# Patient Record
Sex: Female | Born: 1975 | Hispanic: No | Smoking: Former smoker
Health system: Southern US, Community
[De-identification: ages and names within clinical notes are randomized; demographics above are authoritative.]

## PROBLEM LIST (undated history)

## (undated) HISTORY — PX: WISDOM TOOTH EXTRACTION: SHX21

---

## 2006-02-15 ENCOUNTER — Other Ambulatory Visit: Admission: RE | Admit: 2006-02-15 | Discharge: 2006-02-15 | Payer: Self-pay | Admitting: Family Medicine

## 2007-04-18 ENCOUNTER — Other Ambulatory Visit: Admission: RE | Admit: 2007-04-18 | Discharge: 2007-04-18 | Payer: Self-pay | Admitting: Family Medicine

## 2008-06-13 ENCOUNTER — Other Ambulatory Visit: Admission: RE | Admit: 2008-06-13 | Discharge: 2008-06-13 | Payer: Self-pay | Admitting: Family Medicine

## 2009-06-19 ENCOUNTER — Other Ambulatory Visit: Admission: RE | Admit: 2009-06-19 | Discharge: 2009-06-19 | Payer: Self-pay | Admitting: Family Medicine

## 2010-06-19 ENCOUNTER — Other Ambulatory Visit
Admission: RE | Admit: 2010-06-19 | Discharge: 2010-06-19 | Payer: Self-pay | Source: Home / Self Care | Admitting: Family Medicine

## 2011-08-10 ENCOUNTER — Other Ambulatory Visit (HOSPITAL_COMMUNITY)
Admission: RE | Admit: 2011-08-10 | Discharge: 2011-08-10 | Disposition: A | Discharge status: Home / Self Care | Payer: BC Managed Care – PPO | Source: Ambulatory Visit | Attending: Family Medicine | Admitting: Family Medicine

## 2011-08-10 ENCOUNTER — Other Ambulatory Visit: Payer: Self-pay | Admitting: Physician Assistant

## 2011-08-10 DIAGNOSIS — Z01419 Encounter for gynecological examination (general) (routine) without abnormal findings: Secondary | ICD-10-CM | POA: Insufficient documentation

## 2013-02-06 ENCOUNTER — Other Ambulatory Visit (HOSPITAL_COMMUNITY)
Admission: RE | Admit: 2013-02-06 | Discharge: 2013-02-06 | Disposition: A | Discharge status: Home / Self Care | Payer: BC Managed Care – PPO | Source: Ambulatory Visit | Attending: Family Medicine | Admitting: Family Medicine

## 2013-02-06 ENCOUNTER — Other Ambulatory Visit: Payer: Self-pay | Admitting: Physician Assistant

## 2013-02-06 DIAGNOSIS — Z124 Encounter for screening for malignant neoplasm of cervix: Secondary | ICD-10-CM | POA: Insufficient documentation

## 2013-07-23 DIAGNOSIS — F319 Bipolar disorder, unspecified: Secondary | ICD-10-CM

## 2013-07-23 DIAGNOSIS — F419 Anxiety disorder, unspecified: Secondary | ICD-10-CM

## 2013-07-23 DIAGNOSIS — F32A Depression, unspecified: Secondary | ICD-10-CM

## 2013-07-23 HISTORY — DX: Bipolar disorder, unspecified: F31.9

## 2013-07-23 HISTORY — DX: Anxiety disorder, unspecified: F41.9

## 2013-07-23 HISTORY — DX: Depression, unspecified: F32.A

## 2014-05-02 ENCOUNTER — Other Ambulatory Visit: Payer: Self-pay | Admitting: Physician Assistant

## 2014-05-02 ENCOUNTER — Other Ambulatory Visit (HOSPITAL_COMMUNITY)
Admission: RE | Admit: 2014-05-02 | Discharge: 2014-05-02 | Disposition: A | Discharge status: Home / Self Care | Payer: BC Managed Care – PPO | Source: Ambulatory Visit | Attending: Family Medicine | Admitting: Family Medicine

## 2014-05-02 DIAGNOSIS — Z1151 Encounter for screening for human papillomavirus (HPV): Secondary | ICD-10-CM | POA: Diagnosis present

## 2014-05-02 DIAGNOSIS — Z124 Encounter for screening for malignant neoplasm of cervix: Secondary | ICD-10-CM | POA: Diagnosis not present

## 2014-05-02 DIAGNOSIS — R87810 Cervical high risk human papillomavirus (HPV) DNA test positive: Secondary | ICD-10-CM | POA: Insufficient documentation

## 2014-05-04 LAB — CYTOLOGY - PAP

## 2014-06-11 ENCOUNTER — Other Ambulatory Visit: Payer: Self-pay | Admitting: Obstetrics and Gynecology

## 2014-12-11 ENCOUNTER — Other Ambulatory Visit (HOSPITAL_COMMUNITY)
Admission: RE | Admit: 2014-12-11 | Discharge: 2014-12-11 | Disposition: A | Discharge status: Home / Self Care | Payer: BC Managed Care – PPO | Source: Ambulatory Visit | Attending: Obstetrics and Gynecology | Admitting: Obstetrics and Gynecology

## 2014-12-11 ENCOUNTER — Other Ambulatory Visit: Payer: Self-pay | Admitting: Obstetrics and Gynecology

## 2014-12-11 DIAGNOSIS — Z01419 Encounter for gynecological examination (general) (routine) without abnormal findings: Secondary | ICD-10-CM | POA: Insufficient documentation

## 2014-12-13 LAB — CYTOLOGY - PAP

## 2015-06-18 ENCOUNTER — Other Ambulatory Visit (HOSPITAL_COMMUNITY)
Admission: RE | Admit: 2015-06-18 | Discharge: 2015-06-18 | Disposition: A | Discharge status: Home / Self Care | Payer: BC Managed Care – PPO | Source: Ambulatory Visit | Attending: Obstetrics and Gynecology | Admitting: Obstetrics and Gynecology

## 2015-06-18 ENCOUNTER — Other Ambulatory Visit: Payer: Self-pay | Admitting: Obstetrics and Gynecology

## 2015-06-18 DIAGNOSIS — Z01419 Encounter for gynecological examination (general) (routine) without abnormal findings: Secondary | ICD-10-CM | POA: Diagnosis not present

## 2015-06-20 LAB — CYTOLOGY - PAP

## 2017-08-06 ENCOUNTER — Other Ambulatory Visit (HOSPITAL_COMMUNITY)
Admission: RE | Admit: 2017-08-06 | Discharge: 2017-08-06 | Disposition: A | Discharge status: Home / Self Care | Payer: BC Managed Care – PPO | Source: Ambulatory Visit | Attending: Family Medicine | Admitting: Family Medicine

## 2017-08-06 ENCOUNTER — Other Ambulatory Visit: Payer: Self-pay | Admitting: Physician Assistant

## 2017-08-06 DIAGNOSIS — Z01419 Encounter for gynecological examination (general) (routine) without abnormal findings: Secondary | ICD-10-CM | POA: Insufficient documentation

## 2017-08-09 LAB — CYTOLOGY - PAP: DIAGNOSIS: NEGATIVE

## 2018-05-24 ENCOUNTER — Encounter: Payer: Self-pay | Admitting: Emergency Medicine

## 2018-05-24 DIAGNOSIS — G4700 Insomnia, unspecified: Secondary | ICD-10-CM

## 2018-05-24 DIAGNOSIS — F3160 Bipolar disorder, current episode mixed, unspecified: Secondary | ICD-10-CM

## 2018-06-06 ENCOUNTER — Encounter: Payer: Self-pay | Admitting: Physician Assistant

## 2018-06-06 ENCOUNTER — Ambulatory Visit: Payer: BC Managed Care – PPO | Admitting: Physician Assistant

## 2018-06-06 DIAGNOSIS — G4700 Insomnia, unspecified: Secondary | ICD-10-CM | POA: Diagnosis not present

## 2018-06-06 DIAGNOSIS — F331 Major depressive disorder, recurrent, moderate: Secondary | ICD-10-CM

## 2018-06-06 MED ORDER — BUPROPION HCL ER (XL) 150 MG PO TB24: 450.0000 mg | ORAL_TABLET | Freq: Every morning | ORAL | 5 refills | Status: DC

## 2018-06-06 MED ORDER — ESZOPICLONE 3 MG PO TABS: 3.0000 mg | ORAL_TABLET | Freq: Every evening | ORAL | 0 refills | Status: DC | PRN

## 2018-06-06 MED ORDER — ALPRAZOLAM 1 MG PO TABS: 1.0000 mg | ORAL_TABLET | Freq: Two times a day (BID) | ORAL | 5 refills | Status: DC | PRN

## 2018-06-06 MED ORDER — BREXPIPRAZOLE 2 MG PO TABS: 2.0000 mg | ORAL_TABLET | Freq: Every day | ORAL | 5 refills | Status: DC

## 2018-06-06 NOTE — Progress Notes (Signed)
[facility]  Patient ID: Leslie GentaLarissa V Imperato-Goodman,  MRN: [id number]  PCP: Patient, No Pcp Per  Date of Evaluation: 06/06/2018 Time spent:15 minutes  Chief Complaint:  Chief Complaint    Follow-up      HISTORY/CURRENT STATUS: HPI Here for routine med check.  About 4-5 days ago, got into a 'funk.' Took off work 06/02/18 in the afternoon and all day on 06/03/18.  Feels better now.  Not sure what caused it.  Prior to that, was having some impulsivity, spending more than normal, but no risky behaviors.  No increased libido.  No increased energy or grandiosity.  Not able to go to sleep for a few hours, then wakes up around 2:30 to go to the BR.  She has been taking mirtazapine for a while now and it has been effective up until the past month or so.  Then gets up at 4:30 to exercise. Gets about  5 hours of sleep.   Also has been anxious lately.  Has trouble relaxing.  Doesn't like her job and is looking for something else.  The Xanax still works.  Individual Medical History/ Review of Systems: Changes? :No    Past medications for mental health diagnoses include: Depakote, Lamictal, Latuda, Seroquel, melatonin did not help, for regular cause tremor and anxiety, Sonata did not help, Ambien caused her to eat too much, trazodone did not help, Lunesta, Zoloft, Wellbutrin, Restoril did not help, Belsomra  Allergies: Lamictal [lamotrigine]  Current Medications:  Current Outpatient Medications:  .  ALPRAZolam (XANAX) 1 MG tablet, Take 1 tablet (1 mg total) by mouth 2 (two) times daily as needed for anxiety., Disp: 60 tablet, Rfl: 5 .  Brexpiprazole (REXULTI) 2 MG TABS, Take 2 mg by mouth daily., Disp: 30 tablet, Rfl: 5 .  buPROPion (WELLBUTRIN XL) 150 MG 24 hr tablet, Take 3 tablets (450 mg total) by mouth every morning., Disp: 90 tablet, Rfl: 5 .  Eszopiclone (ESZOPICLONE) 3 MG TABS, Take 1 tablet (3 mg total) by mouth at bedtime as needed. Take immediately before bedtime, Disp:  30 tablet, Rfl: 0 .  mirtazapine (REMERON) 30 MG tablet, Take 45 mg by mouth at bedtime. , Disp: , Rfl:  Medication Side Effects: none  Family Medical/ Social History: Changes? No  MENTAL HEALTH EXAM:  There were no vitals taken for this visit.There is no height or weight on file to calculate BMI.  General Appearance: Casual and Well Groomed  Eye Contact:  Good  Speech:  Clear and Coherent  Volume:  Normal  Mood:  Euthymic  Affect:  Appropriate  Thought Process:  Goal Directed  Orientation:  Full (Time, Place, and Person)  Thought Content: Logical   Suicidal Thoughts:  No  Homicidal Thoughts:  No  Memory:  WNL  Judgement:  Good  Insight:  Good  Psychomotor Activity:  Normal  Concentration:  Concentration: Good  Recall:  Good  Fund of Knowledge: Good  Language: Good  Assets:  Desire for Improvement  ADL's:  Intact  Cognition: WNL  Prognosis:  Good    DIAGNOSES:    ICD-10-CM   1. Major depressive disorder, recurrent episode, moderate (HCC) F33.1   2. Insomnia, unspecified type G47.00     Receiving Psychotherapy: No    RECOMMENDATIONS: Wean off mirtazapine by going to 30 mg nightly for 5 days then 15 mg nightly for 5 days and then stop. Restart Lunesta as needed. If Alfonso PattenLunesta is ineffective after about a week, she will call and we will call  in Elavil 25 mg nightly.   Whichever drug is effective, I will refill until the next visit in 6 months.  Melony Overly, PA-C

## 2018-06-09 ENCOUNTER — Other Ambulatory Visit: Payer: Self-pay | Admitting: Physician Assistant

## 2018-06-09 DIAGNOSIS — Z1231 Encounter for screening mammogram for malignant neoplasm of breast: Secondary | ICD-10-CM

## 2018-06-13 ENCOUNTER — Ambulatory Visit
Admission: RE | Admit: 2018-06-13 | Discharge: 2018-06-13 | Disposition: A | Discharge status: Home / Self Care | Payer: BC Managed Care – PPO | Source: Ambulatory Visit | Attending: Physician Assistant | Admitting: Physician Assistant

## 2018-06-13 DIAGNOSIS — Z1231 Encounter for screening mammogram for malignant neoplasm of breast: Secondary | ICD-10-CM

## 2018-06-14 ENCOUNTER — Other Ambulatory Visit: Payer: Self-pay | Admitting: Physician Assistant

## 2018-06-14 DIAGNOSIS — R928 Other abnormal and inconclusive findings on diagnostic imaging of breast: Secondary | ICD-10-CM

## 2018-06-17 ENCOUNTER — Ambulatory Visit
Admission: RE | Admit: 2018-06-17 | Discharge: 2018-06-17 | Disposition: A | Discharge status: Home / Self Care | Payer: BC Managed Care – PPO | Source: Ambulatory Visit | Attending: Physician Assistant | Admitting: Physician Assistant

## 2018-06-17 DIAGNOSIS — R928 Other abnormal and inconclusive findings on diagnostic imaging of breast: Secondary | ICD-10-CM

## 2018-06-27 ENCOUNTER — Other Ambulatory Visit: Payer: Self-pay | Admitting: Physician Assistant

## 2018-06-27 ENCOUNTER — Telehealth: Payer: Self-pay | Admitting: Physician Assistant

## 2018-06-27 MED ORDER — AMITRIPTYLINE HCL 25 MG PO TABS: 25.0000 mg | ORAL_TABLET | Freq: Every evening | ORAL | 0 refills | Status: DC | PRN

## 2018-06-27 NOTE — Telephone Encounter (Signed)
Pt called sleep med not working. Would like to try other med discussed. [facility] [address]

## 2018-06-28 ENCOUNTER — Telehealth: Payer: Self-pay | Admitting: Physician Assistant

## 2018-06-28 NOTE — Telephone Encounter (Signed)
Insurance denying Rexulti.  Needs a generic.  Olanzapine has been recommended. [facility] on [address]

## 2018-06-29 NOTE — Telephone Encounter (Signed)
Let pt know that her pharmacy was trying to run it too soon according to insurance. Instructed to try again today or tomorrow. Call back if any problems and we can get some samples till she gets it filled.

## 2018-06-29 NOTE — Telephone Encounter (Signed)
Will check on Prior Authorization for her, have not been notified from pharmacy about this yet

## 2018-06-29 NOTE — Telephone Encounter (Signed)
Attempted to do PA, it indicated pharmacy is trying to fill too soon

## 2018-06-29 NOTE — Telephone Encounter (Signed)
Left voice mail to call back 

## 2018-07-04 ENCOUNTER — Telehealth: Payer: Self-pay | Admitting: Physician Assistant

## 2018-07-04 ENCOUNTER — Other Ambulatory Visit: Payer: Self-pay

## 2018-07-04 MED ORDER — ARIPIPRAZOLE 5 MG PO TABS: 5.0000 mg | ORAL_TABLET | Freq: Every day | ORAL | 1 refills | Status: DC

## 2018-07-04 NOTE — Telephone Encounter (Signed)
Spoke with pt, see phone msg. New medication escribed to [facility].

## 2018-07-04 NOTE — Telephone Encounter (Signed)
Patient stated she hasn't had any Rexulti in a couple of days. Stated it is not a insurance proble ,but the cost is in another tier group. She can't afford the $700 price tag, and need something as soon as possible. Would like a follow-up call since this has not been taking care of since last week.

## 2018-07-04 NOTE — Telephone Encounter (Signed)
Unable to afford rexulti, change to abilify 5mg  per provider. rx sent to [facility] as requested. Pt notified of change and instructed to check pharmacy.

## 2018-07-11 DIAGNOSIS — Z0289 Encounter for other administrative examinations: Secondary | ICD-10-CM

## 2018-07-26 ENCOUNTER — Other Ambulatory Visit: Payer: Self-pay | Admitting: Physician Assistant

## 2018-08-25 ENCOUNTER — Other Ambulatory Visit: Payer: Self-pay | Admitting: Physician Assistant

## 2018-11-21 ENCOUNTER — Other Ambulatory Visit: Payer: Self-pay | Admitting: Physician Assistant

## 2018-12-12 ENCOUNTER — Encounter: Payer: Self-pay | Admitting: Physician Assistant

## 2018-12-12 ENCOUNTER — Other Ambulatory Visit: Payer: Self-pay

## 2018-12-12 ENCOUNTER — Ambulatory Visit: Payer: BC Managed Care – PPO | Admitting: Physician Assistant

## 2018-12-12 DIAGNOSIS — G4700 Insomnia, unspecified: Secondary | ICD-10-CM

## 2018-12-12 DIAGNOSIS — F331 Major depressive disorder, recurrent, moderate: Secondary | ICD-10-CM

## 2018-12-12 MED ORDER — ARIPIPRAZOLE 5 MG PO TABS: 5.0000 mg | ORAL_TABLET | Freq: Every day | ORAL | 5 refills | Status: DC

## 2018-12-12 MED ORDER — ALPRAZOLAM 1 MG PO TABS: 1.0000 mg | ORAL_TABLET | Freq: Two times a day (BID) | ORAL | 5 refills | Status: DC | PRN

## 2018-12-12 MED ORDER — AMITRIPTYLINE HCL 50 MG PO TABS: 50.0000 mg | ORAL_TABLET | Freq: Every day | ORAL | 5 refills | Status: DC

## 2018-12-12 MED ORDER — BUPROPION HCL ER (XL) 150 MG PO TB24: 450.0000 mg | ORAL_TABLET | Freq: Every day | ORAL | 5 refills | Status: DC

## 2018-12-12 NOTE — Progress Notes (Signed)
[facility]  Patient ID: Leslie Mueller,  MRN: [id number]  PCP: Patient, No Pcp Per  Date of Evaluation: 12/12/2018 Time spent:15 minutes  Chief Complaint:  Chief Complaint    Follow-up      HISTORY/CURRENT STATUS: HPI for 5092-month med check.  Her only real problem is not sleeping all the way through the night.  She has a little trouble going to sleep as well such as staying asleep.  The amitriptyline had worked well in the past but is not working as much anymore.  Patient denies loss of interest in usual activities and is able to enjoy things.  Denies decreased energy or motivation.  Appetite has not changed.  No extreme sadness, tearfulness, or feelings of hopelessness.  Denies any changes in concentration, making decisions or remembering things.  Denies suicidal or homicidal thoughts.  Patient denies increased energy with decreased need for sleep, no increased talkativeness, no racing thoughts, no impulsivity or risky behaviors, no increased spending, no increased libido, no grandiosity.  Anxiety is well controlled with the Xanax.  Individual Medical History/ Review of Systems: Changes? :No    Past medications for mental health diagnoses include: Depakote, Lamictal, Latuda, Seroquel, melatonin did not help, for regular cause tremor and anxiety, Sonata did not help, Ambien caused her to eat too much, trazodone did not help, Lunesta, Zoloft, Wellbutrin, Restoril did not help, Belsomra  Allergies: Lamictal [lamotrigine]  Current Medications:  Current Outpatient Medications:  .  ALPRAZolam (XANAX) 1 MG tablet, Take 1 tablet (1 mg total) by mouth 2 (two) times daily as needed for anxiety., Disp: 60 tablet, Rfl: 5 .  ARIPiprazole (ABILIFY) 5 MG tablet, Take 1 tablet (5 mg total) by mouth daily., Disp: 30 tablet, Rfl: 5 .  buPROPion (WELLBUTRIN XL) 150 MG 24 hr tablet, Take 3 tablets (450 mg total) by mouth daily., Disp: 90 tablet, Rfl: 5 .  amitriptyline (ELAVIL) 50  MG tablet, Take 1 tablet (50 mg total) by mouth at bedtime., Disp: 30 tablet, Rfl: 5 Medication Side Effects: none  Family Medical/ Social History: Changes? Yes she quit her job and education.  Is looking for other kinds of work.  She has been offered several jobs already, 1 in [employer] industry which she would really like to except, due to the coronavirus pandemic things have been on hold.  MENTAL HEALTH EXAM:  There were no vitals taken for this visit.There is no height or weight on file to calculate BMI.  General Appearance: Casual  Eye Contact:  Good  Speech:  Clear and Coherent  Volume:  Normal  Mood:  Euthymic  Affect:  Appropriate  Thought Process:  Goal Directed  Orientation:  Full (Time, Place, and Person)  Thought Content: Logical   Suicidal Thoughts:  No  Homicidal Thoughts:  No  Memory:  WNL  Judgement:  Good  Insight:  Good  Psychomotor Activity:  Normal  Concentration:  Concentration: Good  Recall:  Good  Fund of Knowledge: Good  Language: Good  Assets:  Desire for Improvement  ADL's:  Intact  Cognition: WNL  Prognosis:  Good    DIAGNOSES:    ICD-10-CM   1. Major depressive disorder, recurrent episode, moderate (HCC)  F33.1   2. Insomnia, unspecified type  G47.00     Receiving Psychotherapy: No    RECOMMENDATIONS:  Increase amitriptyline to 50 mg p.o. nightly. Continue Xanax 1 mg p.o. twice daily as needed. Continue Abilify 5 mg daily. Continue Wellbutrin XL 450 mg every morning. Return  in 6 months.  Donnal Moat, PA-C   This record has been created using [employer].  Chart creation errors have been sought, but may not always have been located and corrected. Such creation errors do not reflect on the standard of medical care.

## 2019-01-30 ENCOUNTER — Telehealth: Payer: Self-pay | Admitting: Physician Assistant

## 2019-01-30 NOTE — Telephone Encounter (Signed)
Pt needs a statement for her job that she is prescribed Xanax. She would like to come get it in the next few days.

## 2019-02-03 NOTE — Telephone Encounter (Signed)
Note has been typed.  I'll sign when in office in next few days.  Patient will be notified when ready.

## 2019-06-01 ENCOUNTER — Telehealth: Payer: Self-pay | Admitting: Physician Assistant

## 2019-06-01 NOTE — Telephone Encounter (Signed)
Pt called to request refill on ALPRAZOLAM 1 mg, AMITRIPTYLINE 50 mg, and ARIPIPRAZOLE 5 mg. Patient did mention she would like to increase Amitriptyline to next dose. She has appt. 1/19 ( 1st ava) also on canc list. Had to reschedule from 12/22. Provider not available. [place] on file

## 2019-06-02 ENCOUNTER — Other Ambulatory Visit: Payer: Self-pay | Admitting: Physician Assistant

## 2019-06-02 MED ORDER — AMITRIPTYLINE HCL 75 MG PO TABS: 75.0000 mg | ORAL_TABLET | Freq: Every day | ORAL | 1 refills | Status: DC

## 2019-06-02 MED ORDER — ARIPIPRAZOLE 5 MG PO TABS: 5.0000 mg | ORAL_TABLET | Freq: Every day | ORAL | 1 refills | Status: DC

## 2019-06-02 MED ORDER — ALPRAZOLAM 1 MG PO TABS: 1.0000 mg | ORAL_TABLET | Freq: Two times a day (BID) | ORAL | 1 refills | Status: DC | PRN

## 2019-06-02 NOTE — Telephone Encounter (Signed)
Rxs sent

## 2019-06-13 ENCOUNTER — Ambulatory Visit: Payer: Self-pay | Admitting: Physician Assistant

## 2019-07-10 ENCOUNTER — Other Ambulatory Visit: Payer: Self-pay | Admitting: Physician Assistant

## 2019-07-11 ENCOUNTER — Ambulatory Visit: Payer: Self-pay | Admitting: Physician Assistant

## 2019-08-03 ENCOUNTER — Encounter: Payer: Self-pay | Admitting: Physician Assistant

## 2019-08-03 ENCOUNTER — Other Ambulatory Visit: Payer: Self-pay

## 2019-08-03 ENCOUNTER — Ambulatory Visit (INDEPENDENT_AMBULATORY_CARE_PROVIDER_SITE_OTHER): Payer: Self-pay | Admitting: Physician Assistant

## 2019-08-03 DIAGNOSIS — G4700 Insomnia, unspecified: Secondary | ICD-10-CM

## 2019-08-03 DIAGNOSIS — F411 Generalized anxiety disorder: Secondary | ICD-10-CM

## 2019-08-03 DIAGNOSIS — F331 Major depressive disorder, recurrent, moderate: Secondary | ICD-10-CM

## 2019-08-03 MED ORDER — AMITRIPTYLINE HCL 75 MG PO TABS: 75.0000 mg | ORAL_TABLET | Freq: Every day | ORAL | 5 refills | Status: DC

## 2019-08-03 MED ORDER — ARIPIPRAZOLE 5 MG PO TABS: 5.0000 mg | ORAL_TABLET | Freq: Every day | ORAL | 5 refills | Status: DC

## 2019-08-03 MED ORDER — BUPROPION HCL ER (XL) 150 MG PO TB24: 450.0000 mg | ORAL_TABLET | Freq: Every day | ORAL | 5 refills | Status: DC

## 2019-08-03 MED ORDER — ALPRAZOLAM 1 MG PO TABS: 1.0000 mg | ORAL_TABLET | Freq: Two times a day (BID) | ORAL | 5 refills | Status: DC | PRN

## 2019-08-03 MED ORDER — DIAZEPAM 10 MG PO TABS: 5.0000 mg | ORAL_TABLET | Freq: Three times a day (TID) | ORAL | 0 refills | Status: DC | PRN

## 2019-08-03 NOTE — Progress Notes (Signed)
[facility]  Patient ID: Leslie Mueller,  MRN: [id number]  PCP: Patient, No Pcp Per  Date of Evaluation: 08/03/2019 Time spent:20 minutes  Chief Complaint:  Chief Complaint    Anxiety; Depression; Follow-up      HISTORY/CURRENT STATUS: HPI for 48-month med check.  Patient states she is doing really well overall.  Her mood is good.  She is able to enjoy things.  Energy and motivation are good.  "I feel fatigued sometimes even though I am not tired.  I do not know how to describe it.  I feel like my medicines are working well.  I only sleep about 5-1/2 to 6 hours at night but I feel rested when I get up."  Does not cry easily.  Memory and focus are good.  Denies suicidal or homicidal thoughts.  She does not have increased energy even though she is not needing a lot of sleep right now.  States she is a bit impulsive.  She is not spending out of her usual, but she did buy a TV as well as a new computer but both her of her old ones gave out about the same time.  She was impulsive in that she did not do research on the different products like she normally would have.  She went out the next day after each of those products quit working, and bought what she needed right away.  "That is not like me.  I usually do a lot of research to find the best deal or the best quality or what ever."  Denies getting in trouble spending money, she is not maxing out her credit cards.  No risky behaviors.  No increased libido.  No grandiosity.  No hallucinations.  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  She had been accepted to the program last spring but then COVID hit and she was not able to pursue that job.  This is been a lifelong dream but it still scary.  She ask about taking something on the days that she has to train for a crash in the water.  She is not a swimmer and even though she knows she will have a life jacket on, she is very nervous about that.  The Xanax helps  her usual anxiety but she is afraid that with that level of intense anxiety she will not be able to pass that part of her training.  If she is unable to handle a water rescue, she will be thrown out of the program.  Denies dizziness, syncope, seizures, numbness, tingling, tremor, tics, unsteady gait, slurred speech, confusion. Denies muscle or joint pain, stiffness, or dystonia.  She denies polyuria, polydipsia, polyphagia.  No syncope.  No chest pain palpitations or shortness of breath.  No abdominal pain, nausea or vomiting, constipation or diarrhea.  Individual Medical History/ Review of Systems: Changes? :No    Past medications for mental health diagnoses include: Depakote, Lamictal, Latuda, Seroquel, melatonin did not help, for regular cause tremor and anxiety, Sonata did not help, Ambien caused her to eat too much, trazodone did not help, Lunesta, Zoloft, Wellbutrin, Restoril did not help, Belsomra  Allergies: Lamictal [lamotrigine]  Current Medications:  Current Outpatient Medications:  .  ALPRAZolam (XANAX) 1 MG tablet, Take 1 tablet (1 mg total) by mouth 2 (two) times daily as needed for anxiety., Disp: 60 tablet, Rfl: 5 .  amitriptyline (ELAVIL) 75 MG tablet, Take 1 tablet (75 mg total) by mouth at bedtime., Disp:  30 tablet, Rfl: 5 .  ARIPiprazole (ABILIFY) 5 MG tablet, Take 1 tablet (5 mg total) by mouth daily., Disp: 30 tablet, Rfl: 5 .  buPROPion (WELLBUTRIN XL) 150 MG 24 hr tablet, Take 3 tablets (450 mg total) by mouth daily., Disp: 90 tablet, Rfl: 5 .  diazepam (VALIUM) 10 MG tablet, Take 0.5-1 tablets (5-10 mg total) by mouth every 8 (eight) hours as needed for anxiety., Disp: 5 tablet, Rfl: 0 Medication Side Effects: none  Family Medical/ Social History: Changes? Will be starting with [employer] in April. 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  She likes what she does now even though  it is a lot of work.  MENTAL HEALTH EXAM:  There were no vitals taken for this visit.There is no height or weight on file to calculate BMI.  General Appearance: Casual, Neat and Well Groomed  Eye Contact:  Good  Speech:  Clear and Coherent  Volume:  Normal  Mood:  Euthymic  Affect:  Appropriate  Thought Process:  Goal Directed and Descriptions of Associations: Intact  Orientation:  Full (Time, Place, and Person)  Thought Content: Logical   Suicidal Thoughts:  No  Homicidal Thoughts:  No  Memory:  WNL  Judgement:  Good  Insight:  Good  Psychomotor Activity:  Normal  Concentration:  Concentration: Good  Recall:  Good  Fund of Knowledge: Good  Language: Good  Assets:  Desire for Improvement  ADL's:  Intact  Cognition: WNL  Prognosis:  Good    DIAGNOSES:    ICD-10-CM   1. Major depressive disorder, recurrent episode, moderate (HCC)  F33.1   2. Insomnia, unspecified type  G47.00   3. Generalized anxiety disorder  F41.1     Receiving Psychotherapy: No    RECOMMENDATIONS:  PDMP was reviewed. I spent 20 minutes with her. 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  I recommend that she use Valium but she knows not to take it within 8 hours of taking the Xanax.  I am only giving her a few pills.  Sometimes that works better than the Xanax especially since her body is accustomed to the Xanax.  She verbalizes understanding. Start Valium 10 mg, 1/2-1 p.o. every 8 hours as needed anxiety for emergencies, do not take within 8 hours of taking the Xanax. Continue Wellbutrin XL 450 mg daily. Continue Abilify 5 mg every morning. Continue amitriptyline 75 mg nightly. Continue Xanax 1 mg p.o. twice daily as needed. We will have her sign a release form to get labs from [facility] in 6 months.  Donnal Moat, PA-C

## 2020-01-31 ENCOUNTER — Ambulatory Visit (INDEPENDENT_AMBULATORY_CARE_PROVIDER_SITE_OTHER): Payer: Self-pay | Admitting: Physician Assistant

## 2020-01-31 ENCOUNTER — Other Ambulatory Visit: Payer: Self-pay

## 2020-01-31 ENCOUNTER — Encounter: Payer: Self-pay | Admitting: Physician Assistant

## 2020-01-31 DIAGNOSIS — F411 Generalized anxiety disorder: Secondary | ICD-10-CM

## 2020-01-31 DIAGNOSIS — F3341 Major depressive disorder, recurrent, in partial remission: Secondary | ICD-10-CM

## 2020-01-31 DIAGNOSIS — G4700 Insomnia, unspecified: Secondary | ICD-10-CM

## 2020-01-31 MED ORDER — ARIPIPRAZOLE 5 MG PO TABS: 5.0000 mg | ORAL_TABLET | Freq: Every day | ORAL | 5 refills | Status: DC

## 2020-01-31 MED ORDER — AMITRIPTYLINE HCL 75 MG PO TABS: 75.0000 mg | ORAL_TABLET | Freq: Every day | ORAL | 5 refills | Status: DC

## 2020-01-31 MED ORDER — ALPRAZOLAM 1 MG PO TABS: 1.0000 mg | ORAL_TABLET | Freq: Two times a day (BID) | ORAL | 5 refills | Status: DC | PRN

## 2020-01-31 MED ORDER — BUPROPION HCL ER (XL) 150 MG PO TB24: 450.0000 mg | ORAL_TABLET | Freq: Every day | ORAL | 5 refills | Status: DC

## 2020-01-31 NOTE — Progress Notes (Addendum)
[facility]  Patient ID: Leslie Mueller,  MRN: [id number]  PCP: Patient, No Pcp Per  Date of Evaluation: 02/09/2020 Time spent:20 minutes  Chief Complaint:  Chief Complaint    Medication Refill      HISTORY/CURRENT STATUS: HPI for 30-month med check.  States she went off her meds for about 2 weeks last month, b/c she didn't get them RF. Then she started feeling anxious so she RF them and feels good again. Able to enjoy things, energy and motivation are good, she sleeps well and feels rested when she gets up the next day.  The amitriptyline is helping with sleep.  She is not isolating.  Not crying easily.  Denies suicidal or homicidal thoughts.  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  She has decided at this time that it is not the right thing for her and she does not plan to try again, at least for now.  She is working 4 part-time jobs and is applying for something full-time.  She does substitute teach.  She has had several interviews and feels like she has done well but did not get offers.  She has 2 masters degrees and is overqualified for some of these positions.  The week or so after she returned home from the flight attendant school, she was pretty depressed then.  She wanted to stay in bed a lot, sleep, did not want to do anything with friends.  But one of her friends told her to snap out of it and she did, and since that time she has done well.  Patient denies increased energy with decreased need for sleep, no increased talkativeness, no racing thoughts, no impulsivity or risky behaviors, no increased spending, no increased libido, no grandiosity, no increased irritability or anger, no paranoia and no hallucinations.  Anxiety is well controlled.  She never took the Valium.  She has only been using the Xanax.  It is still very effective.  Denies dizziness, syncope, seizures, numbness, tingling, tremor, tics,  unsteady gait, slurred speech, confusion. Denies muscle or joint pain, stiffness, or dystonia.  She denies polyuria, polydipsia, polyphagia.  No syncope.  No chest pain palpitations or shortness of breath.  No abdominal pain, nausea or vomiting, constipation or diarrhea.  Individual Medical History/ Review of Systems: Changes? :No    Past medications for mental health diagnoses include: Depakote, Lamictal, Latuda, Seroquel, melatonin did not help, for regular cause tremor and anxiety, Sonata did not help, Ambien caused her to eat too much, trazodone did not help, Lunesta, Zoloft, Wellbutrin, Restoril did not help, Belsomra  Allergies: Lamictal [lamotrigine]  Current Medications:  Current Outpatient Medications:  .  ALPRAZolam (XANAX) 1 MG tablet, Take 1 tablet (1 mg total) by mouth 2 (two) times daily as needed for anxiety., Disp: 60 tablet, Rfl: 5 .  amitriptyline (ELAVIL) 75 MG tablet, Take 1 tablet (75 mg total) by mouth at bedtime., Disp: 30 tablet, Rfl: 5 .  ARIPiprazole (ABILIFY) 5 MG tablet, Take 1 tablet (5 mg total) by mouth daily., Disp: 30 tablet, Rfl: 5 .  buPROPion (WELLBUTRIN XL) 150 MG 24 hr tablet, Take 3 tablets (450 mg total) by mouth daily., Disp: 90 tablet, Rfl: 5 Medication Side Effects: none  Family Medical/ Social History: Changes? Will be starting with [employer] in April. 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  She likes what she does now even though it is a lot of work.  Error in [facility] listed. See HPI  MENTAL HEALTH EXAM:  There were no vitals taken for this visit.There is no height or weight on file to calculate BMI.  General Appearance: Casual, Neat, Well Groomed and Obese  Eye Contact:  Good  Speech:  Clear and Coherent and Normal Rate  Volume:  Normal  Mood:  Euthymic  Affect:  Appropriate  Thought Process:  Goal Directed and Descriptions of Associations: Intact   Orientation:  Full (Time, Place, and Person)  Thought Content: Logical   Suicidal Thoughts:  No  Homicidal Thoughts:  No  Memory:  WNL  Judgement:  Good  Insight:  Good  Psychomotor Activity:  Normal  Concentration:  Concentration: Good  Recall:  Good  Fund of Knowledge: Good  Language: Good  Assets:  Desire for Improvement  ADL's:  Intact  Cognition: WNL  Prognosis:  Good    DIAGNOSES:    ICD-10-CM   1. Generalized anxiety disorder  F41.1   2. Insomnia, unspecified type  G47.00   3. Recurrent major depressive disorder, in partial remission (HCC)  F33.41     Receiving Psychotherapy: No    RECOMMENDATIONS:  PDMP was reviewed. I provided 20 minutes of face-to-face time during this encounter. Glad to see that she is doing so well and wished her good luck on her job interviews. She is self-pay right now and has not had labs done in over a year at least.  She will be seeing her PCP next week and plans to get labs done. Continue Wellbutrin XL 450 mg daily. Continue Abilify 5 mg every morning. Continue amitriptyline 75 mg nightly. Continue Xanax 1 mg p.o. twice daily as needed. Paper order for BMP, hemoglobin A1c and fasting lipid panel was given. Return in 6 months.  Melony Overly, PA-C

## 2020-02-14 ENCOUNTER — Other Ambulatory Visit: Payer: Self-pay

## 2020-02-14 ENCOUNTER — Encounter: Payer: Self-pay | Admitting: Physician Assistant

## 2020-02-14 ENCOUNTER — Ambulatory Visit (INDEPENDENT_AMBULATORY_CARE_PROVIDER_SITE_OTHER): Payer: Self-pay | Admitting: Physician Assistant

## 2020-02-14 DIAGNOSIS — Z87898 Personal history of other specified conditions: Secondary | ICD-10-CM

## 2020-02-14 DIAGNOSIS — F3342 Major depressive disorder, recurrent, in full remission: Secondary | ICD-10-CM

## 2020-02-14 DIAGNOSIS — F411 Generalized anxiety disorder: Secondary | ICD-10-CM

## 2020-02-14 NOTE — Progress Notes (Signed)
[facility]  Patient ID: Leslie Mueller,  MRN: [id number]  PCP: Patient, No Pcp Per  Date of Evaluation: 02/14/2020 Time spent:10 minutes  Chief Complaint:  Chief Complaint    Other      HISTORY/CURRENT STATUS: HPI needs paperwork filled out.  [name] is planning to start back to school in the spring semester.  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  She withdrew from college 10/13/2017 due to severe insomnia and depression.  She was also out of work during that time, and was able to return 12/02/2017.  For at least a year now, she has been doing well.  She sleeps well most of the time.  She is able to enjoy things and her energy and motivation levels are good.  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  She is not isolating.  No suicidal or homicidal thoughts.  She does continue to have anxiety which is treated well with the Xanax.  She does not commonly have panic attacks but more of a generalized sense that something bad may happen.  Patient denies increased energy with decreased need for sleep, no increased talkativeness, no racing thoughts, no impulsivity or risky behaviors, no increased spending, no increased libido, no grandiosity, no increased irritability or anger, and no hallucinations.  Denies dizziness, syncope, seizures, numbness, tingling, tremor, tics, unsteady gait, slurred speech, confusion. Denies muscle or joint pain, stiffness, or dystonia.  Individual Medical History/ Review of Systems: Changes? :No     Past medications for mental health diagnoses include: Depakote, Lamictal, Latuda, Seroquel, melatonin did not help, for regular cause tremor and anxiety, Sonata did not help, Ambien caused her to eat too much, trazodone did not help, Lunesta, Zoloft, Wellbutrin, Restoril did not help, Belsomra   Allergies: Lamictal [lamotrigine]  Current Medications:  Current Outpatient  Medications:    ALPRAZolam (XANAX) 1 MG tablet, Take 1 tablet (1 mg total) by mouth 2 (two) times daily as needed for anxiety., Disp: 60 tablet, Rfl: 5   amitriptyline (ELAVIL) 75 MG tablet, Take 1 tablet (75 mg total) by mouth at bedtime., Disp: 30 tablet, Rfl: 5   ARIPiprazole (ABILIFY) 5 MG tablet, Take 1 tablet (5 mg total) by mouth daily., Disp: 30 tablet, Rfl: 5   buPROPion (WELLBUTRIN XL) 150 MG 24 hr tablet, Take 3 tablets (450 mg total) by mouth daily., Disp: 90 tablet, Rfl: 5 Medication Side Effects: none  Family Medical/ Social History: Changes? 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  General Appearance: Casual, Neat, Well Groomed and Obese  Eye Contact:  Good  Speech:  Clear and Coherent and Normal Rate  Volume:  Normal  Mood:  Euthymic  Affect:  Appropriate  Thought Process:  Goal Directed and Descriptions of Associations: Intact  Orientation:  Full (Time, Place, and Person)  Thought Content: Logical   Suicidal Thoughts:  No  Homicidal Thoughts:  No  Memory:  WNL  Judgement:  Good  Insight:  Good  Psychomotor Activity:  Normal  Concentration:  Concentration: Good and Attention Span: Good  Recall:  Good  Fund of Knowledge: Good  Language: Good  Assets:  Desire for Improvement  ADL's:  Intact  Cognition: WNL  Prognosis:  Good    DIAGNOSES:    ICD-10-CM   1. Recurrent  major depressive disorder, in full remission (HCC)  F33.42   2. Generalized anxiety disorder  F41.1   3. History of insomnia  Z87.898     Receiving Psychotherapy: No    RECOMMENDATIONS:  PDMP was reviewed. I provided 10 minutes of face-to-face time during this encounter. Paperwork will be filled out stating that she has been stable in approximately 1 year with both the depression, anxiety, and insomnia.  She does continue to require medications to  help with the anxiety and depression but those conditions are well controlled.  As far as her mental health is concerned, I recommend she be readmitted to college. Continue Xanax 1 mg, 1 p.o. twice daily as needed. Continue amitriptyline 75 mg, 1 p.o. nightly. Continue Abilify 5 mg, 1 p.o. every morning. Continue Wellbutrin XL 150 mg, 3 p.o. daily. Return in 6 months.  Melony Overly, PA-C

## 2020-02-15 ENCOUNTER — Telehealth: Payer: Self-pay

## 2020-02-15 DIAGNOSIS — Z0289 Encounter for other administrative examinations: Secondary | ICD-10-CM

## 2020-02-15 NOTE — Telephone Encounter (Signed)
School form faxed and copy mailed to [place].

## 2020-02-15 NOTE — Telephone Encounter (Signed)
Form was signed and put in front office box to be processed.

## 2020-02-15 NOTE — Telephone Encounter (Signed)
Noted thank you

## 2020-02-15 NOTE — Telephone Encounter (Signed)
Form completed for patient to be readmitted to college. Given to [place] to review and sign.

## 2020-02-21 ENCOUNTER — Other Ambulatory Visit: Payer: Self-pay | Admitting: Physician Assistant

## 2020-02-21 DIAGNOSIS — Z0000 Encounter for general adult medical examination without abnormal findings: Secondary | ICD-10-CM

## 2020-03-07 ENCOUNTER — Other Ambulatory Visit: Payer: Self-pay

## 2020-03-07 ENCOUNTER — Ambulatory Visit
Admission: RE | Admit: 2020-03-07 | Discharge: 2020-03-07 | Disposition: A | Discharge status: Home / Self Care | Payer: No Typology Code available for payment source | Source: Ambulatory Visit | Attending: Physician Assistant | Admitting: Physician Assistant

## 2020-03-07 DIAGNOSIS — Z0000 Encounter for general adult medical examination without abnormal findings: Secondary | ICD-10-CM

## 2020-08-01 ENCOUNTER — Other Ambulatory Visit: Payer: Self-pay

## 2020-08-01 ENCOUNTER — Ambulatory Visit (INDEPENDENT_AMBULATORY_CARE_PROVIDER_SITE_OTHER): Payer: BC Managed Care – PPO | Admitting: Physician Assistant

## 2020-08-01 ENCOUNTER — Encounter: Payer: Self-pay | Admitting: Physician Assistant

## 2020-08-01 DIAGNOSIS — G4700 Insomnia, unspecified: Secondary | ICD-10-CM | POA: Diagnosis not present

## 2020-08-01 DIAGNOSIS — F3342 Major depressive disorder, recurrent, in full remission: Secondary | ICD-10-CM | POA: Diagnosis not present

## 2020-08-01 DIAGNOSIS — F411 Generalized anxiety disorder: Secondary | ICD-10-CM

## 2020-08-01 MED ORDER — ARIPIPRAZOLE 5 MG PO TABS: 5.0000 mg | ORAL_TABLET | Freq: Every day | ORAL | 5 refills | Status: DC

## 2020-08-01 MED ORDER — ALPRAZOLAM 1 MG PO TABS: 1.0000 mg | ORAL_TABLET | Freq: Two times a day (BID) | ORAL | 5 refills | Status: DC | PRN

## 2020-08-01 MED ORDER — AMITRIPTYLINE HCL 75 MG PO TABS: 75.0000 mg | ORAL_TABLET | Freq: Every day | ORAL | 5 refills | Status: DC

## 2020-08-01 MED ORDER — BUPROPION HCL ER (XL) 150 MG PO TB24: 450.0000 mg | ORAL_TABLET | Freq: Every day | ORAL | 5 refills | Status: DC

## 2020-08-01 NOTE — Progress Notes (Signed)
[facility]  Patient ID: Leslie Mueller,  MRN: [id number]  PCP: Patient, No Pcp Per  Date of Evaluation: 08/01/2020 Time spent:30 minutes  Chief Complaint:  Chief Complaint    Anxiety; Depression      HISTORY/CURRENT STATUS: HPI 6 month med check.  Doing well.  This combination of medication is working well for her.  Work is going well although she is very busy.   Is able to enjoy things.  Denies decreased energy or motivation.  Appetite has not changed.  No extreme sadness, tearfulness, or feelings of hopelessness.  Denies any changes in concentration, making decisions or remembering things.  Denies suicidal or homicidal thoughts.  She does continue to have anxiety which is treated well with the Xanax.  She does not commonly have panic attacks but more of a generalized sense that something bad may happen.  Patient denies increased energy with decreased need for sleep, no increased talkativeness, no racing thoughts, no impulsivity or risky behaviors, no increased spending, no increased libido, no grandiosity, no increased irritability or anger, and no hallucinations.  Denies dizziness, syncope, seizures, numbness, tingling, tremor, tics, unsteady gait, slurred speech, confusion. Denies muscle or joint pain, stiffness, or dystonia.  Individual Medical History/ Review of Systems: Changes? :Yes   fx of right foot in Sept 2021. Having hysterectomy in a few weeks.   Past medications for mental health diagnoses include: Depakote, Lamictal, Latuda, Seroquel, melatonin did not help, for regular cause tremor and anxiety, Sonata did not help, Ambien caused her to eat too much, trazodone did not help, Lunesta, Zoloft, Wellbutrin, Restoril did not help, Belsomra   Allergies: Lamictal [lamotrigine]  Current Medications:  Current Outpatient Medications:  Marland Kitchen  Multiple Vitamin (MULTIVITAMIN) tablet, Take 1 tablet by mouth daily., Disp: , Rfl:  .  ALPRAZolam (XANAX) 1 MG tablet,  Take 1 tablet (1 mg total) by mouth 2 (two) times daily as needed for anxiety., Disp: 60 tablet, Rfl: 5 .  amitriptyline (ELAVIL) 75 MG tablet, Take 1 tablet (75 mg total) by mouth at bedtime., Disp: 30 tablet, Rfl: 5 .  ARIPiprazole (ABILIFY) 5 MG tablet, Take 1 tablet (5 mg total) by mouth daily., Disp: 30 tablet, Rfl: 5 .  buPROPion (WELLBUTRIN XL) 150 MG 24 hr tablet, Take 3 tablets (450 mg total) by mouth daily., Disp: 90 tablet, Rfl: 5 Medication Side Effects: none  Family Medical/ Social History: Changes? 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  General Appearance: Casual, Neat, Well Groomed and Obese  Eye Contact:  Good  Speech:  Clear and Coherent and Normal Rate  Volume:  Normal  Mood:  Euthymic  Affect:  Appropriate  Thought Process:  Goal Directed and Descriptions of Associations: Intact  Orientation:  Full (Time, Place, and Person)  Thought Content: Logical   Suicidal Thoughts:  No  Homicidal Thoughts:  No  Memory:  WNL  Judgement:  Good  Insight:  Good  Psychomotor Activity:  Normal  Concentration:  Concentration: Good and Attention Span: Good  Recall:  Good  Fund of Knowledge: Good  Language: Good  Assets:  Desire for Improvement  ADL's:  Intact  Cognition: WNL  Prognosis:  Good   04/12/2020 Glucose 90 Total chol. 215,HDL 85, trig 206, ldl130 TSH nl  DIAGNOSES:    ICD-10-CM   1. Recurrent major depressive disorder, in full  remission (HCC)  F33.42   2. Generalized anxiety disorder  F41.1   3. Insomnia, unspecified type  G47.00     Receiving Psychotherapy: No    RECOMMENDATIONS:  PDMP was reviewed. I provided 30 minutes of face-to-face time during this encounter, in which we discussed her diagnosis, current treatment that is very effective, and time spent before and after the visit reviewing most recent labs.   She is doing really well so no changes need to be made. Continue Xanax 1 mg, 1 p.o. twice daily as needed. Continue amitriptyline 75 mg nightly. Continue Abilify 5 mg, 1 p.o. every morning. Continue Wellbutrin XL 150 mg, 3 p.o. daily. Continue multivitamin daily. Return in 6 months.  Melony Overly, PA-C

## 2020-08-08 ENCOUNTER — Other Ambulatory Visit: Payer: Self-pay | Admitting: Obstetrics and Gynecology

## 2020-08-08 NOTE — Progress Notes (Signed)
Your procedure is scheduled on Wednesday Aug 14, 2020.  Report to [facility] Main Entrance "A" at 05:30 A.M., and check in at the Admitting office.  Call this number if you have problems the morning of surgery: [phone]  Call [phone] if you have any questions prior to your surgery date Monday-Friday 8am-4pm   Remember: Do not eat or drink after midnight the night before your surgery  Take these medicines the morning of surgery with A SIP OF WATER: ARIPiprazole (ABILIFY) buPROPion (WELLBUTRIN XL)   If needed: ALPRAZolam Prudy Feeler)    As of today, STOP taking any Aspirin (unless otherwise instructed by your surgeon), Aleve, Naproxen, Ibuprofen, Motrin, Advil, Goody's, BC's, all herbal medications, fish oil, and all vitamins.    The Morning of Surgery  Do not wear jewelry, make-up or nail polish.  Do not wear lotions, powders, or perfumes, or deodorant  Do not shave 48 hours prior to surgery.  .  Do not bring valuables to the hospital.  [facility] is not responsible for any belongings or valuables.  If you are a smoker, DO NOT Smoke 24 hours prior to surgery  If you wear a CPAP at night please bring your mask the morning of surgery   Remember that you must have someone to transport you home after your surgery, and remain with you for 24 hours if you are discharged the same day.   Please bring cases for contacts, glasses, hearing aids, dentures or bridgework because it cannot be worn into surgery.    Leave your suitcase in the car.  After surgery it may be brought to your room.  For patients admitted to the hospital, discharge time will be determined by your treatment team.  Patients discharged the day of surgery will not be allowed to drive home.    Special instructions:   [place]- Preparing For Surgery  Before surgery, you can play an important role. Because skin is not sterile, your skin needs to be as free of germs as possible. You can reduce the number  of germs on your skin by washing with CHG (chlorahexidine gluconate) Soap before surgery.  CHG is an antiseptic cleaner which kills germs and bonds with the skin to continue killing germs even after washing.    Oral Hygiene is also important to reduce your risk of infection.  Remember - BRUSH YOUR TEETH THE MORNING OF SURGERY WITH YOUR REGULAR TOOTHPASTE  Please do not use if you have an allergy to CHG or antibacterial soaps. If your skin becomes reddened/irritated stop using the CHG.  Do not shave (including legs and underarms) for at least 48 hours prior to first CHG shower. It is OK to shave your face.  Please follow these instructions carefully.   1. Shower the NIGHT BEFORE SURGERY and the MORNING OF SURGERY with CHG Soap.   2. If you chose to wash your hair and body, wash as usual with your normal shampoo and body-wash/soap.  3. Rinse your hair and body thoroughly to remove the shampoo and soap.  4. Apply CHG directly to the skin (ONLY FROM THE NECK DOWN) and wash gently with a scrungie or a clean washcloth.   5. Do not use on open wounds or open sores. Avoid contact with your eyes, ears, mouth and genitals (private parts). Wash Face and genitals (private parts)  with your normal soap.   6. Wash thoroughly, paying special attention to the area where your surgery will be performed.  7. Thoroughly rinse  your body with warm water from the neck down.  8. DO NOT shower/wash with your normal soap after using and rinsing off the CHG Soap.  9. Pat yourself dry with a CLEAN TOWEL.  10. Wear CLEAN PAJAMAS to bed the night before surgery  11. Place CLEAN SHEETS on your bed the night of your first shower and DO NOT SLEEP WITH PETS.  12. Wear comfortable clothes the morning of surgery.     Day of Surgery:  Please shower the morning of surgery with the CHG soap Do not apply any deodorants/lotions. Please wear clean clothes to the hospital/surgery center.   Remember to brush your teeth  WITH YOUR REGULAR TOOTHPASTE.   Please read over the following fact sheets that you were given.

## 2020-08-09 ENCOUNTER — Encounter (HOSPITAL_COMMUNITY): Payer: Self-pay

## 2020-08-09 ENCOUNTER — Encounter (HOSPITAL_COMMUNITY)
Admission: RE | Admit: 2020-08-09 | Discharge: 2020-08-09 | Disposition: A | Discharge status: Home / Self Care | Payer: No Typology Code available for payment source | Source: Ambulatory Visit | Attending: Obstetrics and Gynecology | Admitting: Obstetrics and Gynecology

## 2020-08-09 ENCOUNTER — Other Ambulatory Visit: Payer: Self-pay

## 2020-08-09 DIAGNOSIS — Z01812 Encounter for preprocedural laboratory examination: Secondary | ICD-10-CM | POA: Insufficient documentation

## 2020-08-09 LAB — CBC
HCT: 41.5 % (ref 36.0–46.0)
Hemoglobin: 14.2 g/dL (ref 12.0–15.0)
MCH: 32.1 pg (ref 26.0–34.0)
MCHC: 34.2 g/dL (ref 30.0–36.0)
MCV: 93.7 fL (ref 80.0–100.0)
Platelets: 372 10*3/uL (ref 150–400)
RBC: 4.43 MIL/uL (ref 3.87–5.11)
RDW: 12.4 % (ref 11.5–15.5)
WBC: 9.2 10*3/uL (ref 4.0–10.5)
nRBC: 0 % (ref 0.0–0.2)

## 2020-08-09 LAB — BASIC METABOLIC PANEL
Anion gap: 11 (ref 5–15)
BUN: 15 mg/dL (ref 6–20)
CO2: 25 mmol/L (ref 22–32)
Calcium: 9.7 mg/dL (ref 8.9–10.3)
Chloride: 101 mmol/L (ref 98–111)
Creatinine, Ser: 1.06 mg/dL — ABNORMAL HIGH (ref 0.44–1.00)
GFR, Estimated: >60 mL/min (ref >60)
Glucose, Bld: 90 mg/dL (ref 70–99)
Potassium: 4 mmol/L (ref 3.5–5.1)
Sodium: 137 mmol/L (ref 135–145)

## 2020-08-09 LAB — TYPE AND SCREEN
ABO/RH(D): A POS
Antibody Screen: NEGATIVE

## 2020-08-09 NOTE — Progress Notes (Signed)
PCP - Dr. Gasper Lloyd with [facility]  Cardiologist - Denies  Chest x-ray - Not indicated EKG - Not indicated Stress Test - Denies ECHO - Denies Cardiac Cath - Denies  Sleep Study - Yes no OSA  DM - Denies  COVID TEST- 08/10/20 @9am   Anesthesia review: No  Patient denies shortness of breath, fever, cough and chest pain at PAT appointment   All instructions explained to the patient, with a verbal understanding of the material. Patient agrees to go over the instructions while at home for a better understanding. Patient also instructed to self quarantine after being tested for COVID-19. The opportunity to ask questions was provided.

## 2020-08-10 ENCOUNTER — Other Ambulatory Visit (HOSPITAL_COMMUNITY)
Admission: RE | Admit: 2020-08-10 | Discharge: 2020-08-10 | Disposition: A | Discharge status: Home / Self Care | Payer: No Typology Code available for payment source | Source: Ambulatory Visit | Attending: Obstetrics and Gynecology | Admitting: Obstetrics and Gynecology

## 2020-08-10 DIAGNOSIS — Z20822 Contact with and (suspected) exposure to covid-19: Secondary | ICD-10-CM | POA: Insufficient documentation

## 2020-08-10 DIAGNOSIS — Z01812 Encounter for preprocedural laboratory examination: Secondary | ICD-10-CM | POA: Insufficient documentation

## 2020-08-10 LAB — SARS CORONAVIRUS 2 (TAT 6-24 HRS): SARS Coronavirus 2: NEGATIVE

## 2020-08-13 ENCOUNTER — Other Ambulatory Visit: Payer: Self-pay | Admitting: Obstetrics and Gynecology

## 2020-08-13 NOTE — H&P (Signed)
Reason for Appointment  1. Preop   History of Present Illness  Isolation Precautions:  Has patient received COVID-19 vaccination? Yes- Moderna. Does patient report new onset of COVID symptoms? No. Has patient or close contact tested positive for COVID-19? No , not in the past 2 weeks.  General:  45 yo presents for pre-op visit. Pt is scheduled for vaginal hysterectomy with bilateral salpingectomy on Aug 14, 2020 for uterine prolapse. Pt last seen Jun 03, 2020 c/o uterine and bladder protrusion. Endorsed urinary frequency and a h/o mild constipation. Pelvic revealed grade 3 uterine prolapse. No cystocele or rectocele noted. Pt expressed interest in definitive therapy w/ hysterectomy. U/S on Jun 12, 2020 revealed uterus measuring 9.4 x 7.8 x 5.5 cm. Endometrium thin at 3.0 mm. 4 fibroids measured w/ the largest submucosal and measuring 1.8 cm. Bilat OV WNL. No adnexal masses seen. Today, pt confirmed she is not interested in maintaining future fertility. Pelvic exam revealed grade 2-3 uterine prolapse, otherwise normal.    Current Medications  Taking   MVI   Ortho Tri-Cyclen (28)(Norgestimate-Ethinyl Estradiol) 0.18/0.215/0.25 MG-35 MCG Tablet 1 tablet Orally Once a day  Abilify(ARIPiprazole) 5 MG Tablet 1 tablet Orally Once a day, Notes: per psych  Xanax(ALPRAZolam) 2 MG Tablet 1 tablet Orally as needed, Notes: per Psych  Wellbutrin SR(buPROPion HCl) 150 MG Tablet Extended Release 12 Hour 3 tablet in the morning Orally Once a day, Notes: per Psych  Not-Taking   Amitriptyline HCl 75 MG Tablet 1 tablet at bedtime Orally Once a day, Notes: per psych  Medication List reviewed and reconciled with the patient   Past Medical History  Over active Bladder.   Bipolar: Depression/ Anxiety ([facility]).   Seasonal Allergies.    Surgical History  Wisdom Teeth    Family History  Father: alive 9 yrs  Mother: alive 58 yrs, Depression - Drug Abuse, CVA age 6 yo, diagnosed with CVA   Brother 1: alive 53 yrs, schizoaffective d/o  Brother2: alive 56 yrs  Sister 1: deceased, diagnosed with Ovarian cancer  2 brother(s) . 1 son(s) .   Grandmother - Glaucoma\\nGrandmother - Cancer (female).   Social History  General:  Tobacco use cigarettes: Former smoker, Quit in year 2011, Tobacco history last updated 08/02/2020, Vaping No. no EXPOSURE TO PASSIVE SMOKE. Alcohol: yes, social. no Caffeine. no Recreational drug use. Exercise: cardio 3-4 days a week. DENTAL CARE: good. Marital Status: single, Divorced. Children: 1 son. 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 COMMUNICATION BARRIERS: none.    Gyn History  Sexual activity not currently sexually active.  Periods : every month.  LMP 07/19/2020.  Birth control OCP.  Last pap smear date 08/06/17-neg.  Last mammogram date 03/07/2020 - normal.  H/O Abnormal pap smear.    OB History  Number of pregnancies 1.  Pregnancy # 1 live birth, boy, vaginal delivery.    Allergies  N.K.D.A.   Hospitalization/Major Diagnostic Procedure  Childbirth 1994   Review of Systems  CONSTITUTIONAL:  Chills No. Fatigue No. Fever No. Night sweats No. Recent travel outside [place] No. Sweats No. Weight change No.  OPHTHALMOLOGY:  Blurring of vision no. Change in vision no. Double vision no.  ENT:  Dizziness no. Nose bleeds no. Sore throat no. Teeth pain no.  ALLERGY:  Hives no.  CARDIOLOGY:  Chest pain no. High blood pressure no. Irregular heart beat no. Leg edema no. Palpitations no.  RESPIRATORY:  Shortness of breath no. Cough no. Wheezing no.  UROLOGY:  Pain with urination no. Urinary  urgency no. Urinary frequency YES. Urinary incontinence no. Difficulty urinating No. Blood in urine No.  GASTROENTEROLOGY:  Abdominal pain no. Appetite change no. Bloating/belching no. Blood in stool or on toilet paper no. Change in bowel movements no. Constipation no. Diarrhea no. Difficulty swallowing no. Nausea no.  FEMALE REPRODUCTIVE:  Vulvar pain no.  Vulvar rash no. Abnormal vaginal bleeding no. Breast pain no. Nipple discharge no. Pain with intercourse no. Pelvic pain no. Unusual vaginal discharge no. Vaginal itching no.  MUSCULOSKELETAL:  Muscle aches no.  NEUROLOGY:  Headache no. Tingling/numbness no. Weakness no.  PSYCHOLOGY:  Depression YES. Anxiety YES. Nervousness no. Sleep disturbances YES. Suicidal ideation no .  ENDOCRINOLOGY:  Excessive thirst no. Excessive urination no. Hair loss no. Heat or cold intolerance no.  HEMATOLOGY/LYMPH:  Abnormal bleeding no. Easy bruising no. Swollen glands no.  DERMATOLOGY:  New/changing skin lesion no. Rash no. Sores no.  Negative except as stated in HPI.    Vital Signs  Wt 219.3, Wt change -5.7 lb, Ht 63.5, BMI 38.23, Pulse sitting 109, BP sitting 120/80.   Examination  General Examination: CONSTITUTIONAL: alert, oriented, NAD . SKIN: moist, warm. EYES: Conjunctiva clear. LUNGS: good I:E efffort noted, CTA bilat. HEART: RRR. ABDOMEN: soft, non-tender/non-distended, bowel sounds present . FEMALE GENITOURINARY: normal external genitalia, labia - unremarkable, vagina - pink moist mucosa, no lesions or abnormal discharge, cervix - no discharge or lesions or CMT, adnexa - no masses or tenderness, uterus - nontender and normal size on palpation, grade 2-3 uterine prolapse. PSYCH: affect normal, good eye contact.     Physical Examination  Pt aware of scribe services today.   Assessments     1. Uterine prolapse - N81.4 (Primary)   Treatment  1. Uterine prolapse  Notes: Pt is scheduled for vaginal hysterectomy with bilateral salpingectomy on Aug 14, 2020 for uterine prolapse. Discussed that her ovaries will not be removed to prevent early onset menopause. Pt advised she will stay overnight and can return home the next day. She is advised that in order to be discharged from hospital, she will need to be able to ambulate, urinate, flatulate, tolerate food by mouth, and take pain medication by  mouth. Discussed risks of hysterectomy including but not limited to infection, bleeding, damage to her bowel, bladder, or ureters, with the need for further surgery. Discussed risk of blood transfusion and risk of HIV or hep B&C with blood transfusion. Pt is aware of risks and desires blood transfusion if needed. Pt advised to avoid NSAIDs (Aspirin, Aleve, Advil, Ibuprofen, Motrin) from now until surgery given risk of bleeding during surgery. She may take Tylenol for pain management. She is advised to avoid eating or drinking starting midnight prior to surgery. Discussed post-surgery avoidance of driving for 1 week and avoidance of lifting weight greater than 10 lbs or intercourse for 6-8 weeks after procedure. Follow up in 4 weeks for 2 wk post-op visit.

## 2020-08-13 NOTE — H&P (Deleted)
  The note originally documented on this encounter has been moved the the encounter in which it belongs.  

## 2020-08-14 ENCOUNTER — Observation Stay (HOSPITAL_COMMUNITY)
Admission: RE | Admit: 2020-08-14 | Discharge: 2020-08-15 | Disposition: A | Discharge status: Home / Self Care | Payer: BC Managed Care – PPO | Source: Ambulatory Visit | Attending: Obstetrics and Gynecology | Admitting: Obstetrics and Gynecology

## 2020-08-14 ENCOUNTER — Observation Stay (HOSPITAL_COMMUNITY): Payer: BC Managed Care – PPO | Admitting: Certified Registered Nurse Anesthetist

## 2020-08-14 ENCOUNTER — Encounter (HOSPITAL_COMMUNITY)
Admission: RE | Disposition: A | Discharge status: Home / Self Care | Payer: Self-pay | Source: Ambulatory Visit | Attending: Obstetrics and Gynecology

## 2020-08-14 ENCOUNTER — Other Ambulatory Visit: Payer: Self-pay

## 2020-08-14 ENCOUNTER — Encounter (HOSPITAL_COMMUNITY): Payer: Self-pay | Admitting: Obstetrics and Gynecology

## 2020-08-14 ENCOUNTER — Observation Stay (HOSPITAL_COMMUNITY): Payer: BC Managed Care – PPO | Admitting: Vascular Surgery

## 2020-08-14 DIAGNOSIS — N814 Uterovaginal prolapse, unspecified: Principal | ICD-10-CM | POA: Diagnosis present

## 2020-08-14 DIAGNOSIS — Z90710 Acquired absence of both cervix and uterus: Secondary | ICD-10-CM | POA: Diagnosis present

## 2020-08-14 HISTORY — PX: CYSTOSCOPY: SHX5120

## 2020-08-14 HISTORY — PX: VAGINAL HYSTERECTOMY: SHX2639

## 2020-08-14 LAB — ABO/RH: ABO/RH(D): A POS

## 2020-08-14 LAB — POCT PREGNANCY, URINE: Preg Test, Ur: NEGATIVE

## 2020-08-14 SURGERY — HYSTERECTOMY, VAGINAL
Anesthesia: General | Site: Uterus

## 2020-08-14 MED ORDER — ESTRADIOL 0.1 MG/GM VA CREA
TOPICAL_CREAM | VAGINAL | Status: AC
  Filled 2020-08-14: qty 42.5

## 2020-08-14 MED ORDER — HYDROMORPHONE HCL 1 MG/ML IJ SOLN
0.2000 mg | INTRAMUSCULAR | Status: DC | PRN
  Administered 2020-08-14: 0.2 mg via INTRAVENOUS
  Filled 2020-08-14: qty 1

## 2020-08-14 MED ORDER — LACTATED RINGERS IV SOLN: INTRAVENOUS | Status: DC

## 2020-08-14 MED ORDER — ALPRAZOLAM 0.25 MG PO TABS: 1.0000 mg | ORAL_TABLET | Freq: Two times a day (BID) | ORAL | Status: DC | PRN

## 2020-08-14 MED ORDER — LIDOCAINE-EPINEPHRINE 1 %-1:100000 IJ SOLN
INTRAMUSCULAR | Status: AC
  Filled 2020-08-14: qty 1

## 2020-08-14 MED ORDER — CHLORHEXIDINE GLUCONATE 0.12 % MT SOLN
15.0000 mL | Freq: Once | OROMUCOSAL | Status: AC
  Administered 2020-08-14: 15 mL via OROMUCOSAL
  Filled 2020-08-14: qty 15

## 2020-08-14 MED ORDER — SUCCINYLCHOLINE CHLORIDE 200 MG/10ML IV SOSY
PREFILLED_SYRINGE | INTRAVENOUS | Status: AC
  Filled 2020-08-14: qty 10

## 2020-08-14 MED ORDER — INDIGOTINDISULFONATE SODIUM 8 MG/ML IJ SOLN
5.0000 mL | INTRAMUSCULAR | Status: DC
  Filled 2020-08-14: qty 5

## 2020-08-14 MED ORDER — OXYCODONE HCL 5 MG PO TABS: 5.0000 mg | ORAL_TABLET | Freq: Once | ORAL | Status: DC | PRN

## 2020-08-14 MED ORDER — LIDOCAINE 2% (20 MG/ML) 5 ML SYRINGE
INTRAMUSCULAR | Status: AC
  Filled 2020-08-14: qty 5

## 2020-08-14 MED ORDER — SUGAMMADEX SODIUM 200 MG/2ML IV SOLN
INTRAVENOUS | Status: DC | PRN
  Administered 2020-08-14: 200 mg via INTRAVENOUS

## 2020-08-14 MED ORDER — ARIPIPRAZOLE 5 MG PO TABS
5.0000 mg | ORAL_TABLET | Freq: Every day | ORAL | Status: DC
  Administered 2020-08-14: 5 mg via ORAL
  Filled 2020-08-14 (×2): qty 1

## 2020-08-14 MED ORDER — FENTANYL CITRATE (PF) 100 MCG/2ML IJ SOLN
INTRAMUSCULAR | Status: DC | PRN
  Administered 2020-08-14: 100 ug via INTRAVENOUS

## 2020-08-14 MED ORDER — DEXMEDETOMIDINE (PRECEDEX) IN NS 20 MCG/5ML (4 MCG/ML) IV SYRINGE
PREFILLED_SYRINGE | INTRAVENOUS | Status: DC | PRN
  Administered 2020-08-14: 10 ug via INTRAVENOUS

## 2020-08-14 MED ORDER — SENNA 8.6 MG PO TABS
1.0000 | ORAL_TABLET | Freq: Two times a day (BID) | ORAL | Status: DC
  Administered 2020-08-14: 8.6 mg via ORAL
  Filled 2020-08-14: qty 1

## 2020-08-14 MED ORDER — MENTHOL 3 MG MT LOZG: 1.0000 | LOZENGE | OROMUCOSAL | Status: DC | PRN

## 2020-08-14 MED ORDER — SIMETHICONE 80 MG PO CHEW: 80.0000 mg | CHEWABLE_TABLET | Freq: Four times a day (QID) | ORAL | Status: DC | PRN

## 2020-08-14 MED ORDER — OXYCODONE HCL 5 MG PO TABS: 5.0000 mg | ORAL_TABLET | Freq: Four times a day (QID) | ORAL | 0 refills | Status: DC | PRN

## 2020-08-14 MED ORDER — MIDAZOLAM HCL 5 MG/5ML IJ SOLN
INTRAMUSCULAR | Status: DC | PRN
  Administered 2020-08-14: 2 mg via INTRAVENOUS

## 2020-08-14 MED ORDER — ONDANSETRON HCL 4 MG/2ML IJ SOLN
INTRAMUSCULAR | Status: DC | PRN
  Administered 2020-08-14: 4 mg via INTRAVENOUS

## 2020-08-14 MED ORDER — LACTATED RINGERS IV SOLN
INTRAVENOUS | Status: DC
  Administered 2020-08-14: 125 mL/h via INTRAVENOUS

## 2020-08-14 MED ORDER — MIDAZOLAM HCL 2 MG/2ML IJ SOLN
INTRAMUSCULAR | Status: AC
  Filled 2020-08-14: qty 2

## 2020-08-14 MED ORDER — DEXAMETHASONE SODIUM PHOSPHATE 10 MG/ML IJ SOLN
INTRAMUSCULAR | Status: DC | PRN
  Administered 2020-08-14: 10 mg via INTRAVENOUS

## 2020-08-14 MED ORDER — INDIGOTINDISULFONATE SODIUM 8 MG/ML IJ SOLN
INTRAMUSCULAR | Status: DC | PRN
  Administered 2020-08-14: 5 mL via INTRAVENOUS

## 2020-08-14 MED ORDER — FENTANYL CITRATE (PF) 250 MCG/5ML IJ SOLN
INTRAMUSCULAR | Status: AC
  Filled 2020-08-14: qty 5

## 2020-08-14 MED ORDER — PANTOPRAZOLE SODIUM 40 MG PO TBEC
40.0000 mg | DELAYED_RELEASE_TABLET | Freq: Every day | ORAL | Status: DC
  Administered 2020-08-14: 40 mg via ORAL
  Filled 2020-08-14: qty 1

## 2020-08-14 MED ORDER — ONDANSETRON HCL 4 MG PO TABS: 4.0000 mg | ORAL_TABLET | Freq: Four times a day (QID) | ORAL | Status: DC | PRN

## 2020-08-14 MED ORDER — ACETAMINOPHEN 500 MG PO TABS
1000.0000 mg | ORAL_TABLET | ORAL | Status: AC
  Administered 2020-08-14: 1000 mg via ORAL
  Filled 2020-08-14: qty 2

## 2020-08-14 MED ORDER — PROPOFOL 10 MG/ML IV BOLUS
INTRAVENOUS | Status: DC | PRN
  Administered 2020-08-14: 200 mg via INTRAVENOUS

## 2020-08-14 MED ORDER — ORAL CARE MOUTH RINSE: 15.0000 mL | Freq: Once | OROMUCOSAL | Status: AC

## 2020-08-14 MED ORDER — PROPOFOL 10 MG/ML IV BOLUS
INTRAVENOUS | Status: AC
  Filled 2020-08-14: qty 40

## 2020-08-14 MED ORDER — HYDROMORPHONE HCL 1 MG/ML IJ SOLN
INTRAMUSCULAR | Status: AC
  Filled 2020-08-14: qty 1

## 2020-08-14 MED ORDER — AMITRIPTYLINE HCL 25 MG PO TABS
75.0000 mg | ORAL_TABLET | Freq: Every day | ORAL | Status: DC
Start: 2020-08-14 — End: 2020-08-15
  Administered 2020-08-14: 75 mg via ORAL
  Filled 2020-08-14: qty 1
  Filled 2020-08-14: qty 3

## 2020-08-14 MED ORDER — OXYCODONE HCL 5 MG/5ML PO SOLN: 5.0000 mg | Freq: Once | ORAL | Status: DC | PRN

## 2020-08-14 MED ORDER — 0.9 % SODIUM CHLORIDE (POUR BTL) OPTIME
TOPICAL | Status: DC | PRN
  Administered 2020-08-14: 1000 mL

## 2020-08-14 MED ORDER — ROCURONIUM BROMIDE 10 MG/ML (PF) SYRINGE
PREFILLED_SYRINGE | INTRAVENOUS | Status: DC | PRN
  Administered 2020-08-14: 70 mg via INTRAVENOUS

## 2020-08-14 MED ORDER — ACETAMINOPHEN 500 MG PO TABS: 1000.0000 mg | ORAL_TABLET | Freq: Three times a day (TID) | ORAL | 0 refills | Status: AC | PRN

## 2020-08-14 MED ORDER — LIDOCAINE-EPINEPHRINE 1 %-1:100000 IJ SOLN
INTRAMUSCULAR | Status: DC | PRN
  Administered 2020-08-14: 13 mL

## 2020-08-14 MED ORDER — ONDANSETRON HCL 4 MG/2ML IJ SOLN: 4.0000 mg | Freq: Four times a day (QID) | INTRAMUSCULAR | Status: DC | PRN

## 2020-08-14 MED ORDER — IBUPROFEN 800 MG PO TABS
800.0000 mg | ORAL_TABLET | Freq: Three times a day (TID) | ORAL | Status: DC
  Administered 2020-08-14 – 2020-08-15 (×3): 800 mg via ORAL
  Filled 2020-08-14 (×3): qty 1

## 2020-08-14 MED ORDER — HYDROMORPHONE HCL 1 MG/ML IJ SOLN
0.2500 mg | INTRAMUSCULAR | Status: DC | PRN
  Administered 2020-08-14 (×3): 0.5 mg via INTRAVENOUS

## 2020-08-14 MED ORDER — ROCURONIUM BROMIDE 10 MG/ML (PF) SYRINGE
PREFILLED_SYRINGE | INTRAVENOUS | Status: AC
  Filled 2020-08-14: qty 10

## 2020-08-14 MED ORDER — LIDOCAINE 2% (20 MG/ML) 5 ML SYRINGE
INTRAMUSCULAR | Status: DC | PRN
  Administered 2020-08-14: 100 mg via INTRAVENOUS

## 2020-08-14 MED ORDER — BUPROPION HCL ER (XL) 300 MG PO TB24
450.0000 mg | ORAL_TABLET | Freq: Every day | ORAL | Status: DC
  Administered 2020-08-14: 450 mg via ORAL
  Filled 2020-08-14 (×2): qty 1

## 2020-08-14 MED ORDER — IBUPROFEN 800 MG PO TABS: 800.0000 mg | ORAL_TABLET | Freq: Three times a day (TID) | ORAL | 1 refills | Status: AC | PRN

## 2020-08-14 MED ORDER — KETOROLAC TROMETHAMINE 30 MG/ML IJ SOLN: 30.0000 mg | Freq: Once | INTRAMUSCULAR | Status: DC

## 2020-08-14 MED ORDER — OXYCODONE HCL 5 MG PO TABS
5.0000 mg | ORAL_TABLET | ORAL | Status: DC | PRN
Start: 2020-08-14 — End: 2020-08-15
  Administered 2020-08-14 – 2020-08-15 (×3): 5 mg via ORAL
  Filled 2020-08-14 (×3): qty 1

## 2020-08-14 MED ORDER — SODIUM CHLORIDE 0.9 % IR SOLN
Status: DC | PRN
  Administered 2020-08-14: 1000 mL via INTRAVESICAL

## 2020-08-14 MED ORDER — ESTRADIOL 0.1 MG/GM VA CREA
TOPICAL_CREAM | VAGINAL | Status: DC | PRN
  Administered 2020-08-14: 1 via VAGINAL

## 2020-08-14 MED ORDER — POVIDONE-IODINE 10 % EX SWAB
2.0000 "application " | Freq: Once | CUTANEOUS | Status: AC
  Administered 2020-08-14: 2 via TOPICAL

## 2020-08-14 MED ORDER — ALUM & MAG HYDROXIDE-SIMETH 200-200-20 MG/5ML PO SUSP: 30.0000 mL | ORAL | Status: DC | PRN

## 2020-08-14 MED ORDER — CELECOXIB 200 MG PO CAPS
400.0000 mg | ORAL_CAPSULE | ORAL | Status: AC
  Administered 2020-08-14: 400 mg via ORAL
  Filled 2020-08-14: qty 2

## 2020-08-14 MED ORDER — ACETAMINOPHEN 500 MG PO TABS
1000.0000 mg | ORAL_TABLET | Freq: Four times a day (QID) | ORAL | Status: DC
  Administered 2020-08-14 – 2020-08-15 (×4): 1000 mg via ORAL
  Filled 2020-08-14 (×4): qty 2

## 2020-08-14 MED ORDER — SODIUM CHLORIDE 0.9 % IV SOLN
2.0000 g | INTRAVENOUS | Status: AC
  Administered 2020-08-14: 2 g via INTRAVENOUS
  Filled 2020-08-14: qty 2

## 2020-08-14 SURGICAL SUPPLY — 28 items
CANISTER SUCT 3000ML PPV (MISCELLANEOUS) ×3 IMPLANT
COVER WAND RF STERILE (DRAPES) ×3 IMPLANT
DECANTER SPIKE VIAL GLASS SM (MISCELLANEOUS) IMPLANT
DRAPE STERI URO 9X17 APER PCH (DRAPES) ×1 IMPLANT
GAUZE PACKING 1 X5 YD ST (GAUZE/BANDAGES/DRESSINGS) ×1 IMPLANT
GAUZE PACKING FOLDED 1IN STRL (GAUZE/BANDAGES/DRESSINGS) ×1 IMPLANT
GLOVE SURG ENC TEXT LTX SZ6.5 (GLOVE) ×3 IMPLANT
GLOVE SURG UNDER POLY LF SZ6.5 (GLOVE) ×3 IMPLANT
GLOVE SURG UNDER POLY LF SZ7 (GLOVE) ×3 IMPLANT
GOWN STRL REUS W/ TWL LRG LVL3 (GOWN DISPOSABLE) ×8 IMPLANT
GOWN STRL REUS W/TWL LRG LVL3 (GOWN DISPOSABLE) ×12
KIT TURNOVER KIT B (KITS) ×3 IMPLANT
LIGASURE IMPACT 36 18CM CVD LR (INSTRUMENTS) ×1 IMPLANT
NS IRRIG 1000ML POUR BTL (IV SOLUTION) ×3 IMPLANT
PACK VAGINAL WOMENS (CUSTOM PROCEDURE TRAY) ×3 IMPLANT
PAD OB MATERNITY 4.3X12.25 (PERSONAL CARE ITEMS) ×3 IMPLANT
SET CYSTO W/LG BORE CLAMP LF (SET/KITS/TRAYS/PACK) IMPLANT
SUT CHROMIC 2 0 CT 1 (SUTURE) IMPLANT
SUT VIC AB 0 CT1 18XCR BRD8 (SUTURE) ×4 IMPLANT
SUT VIC AB 0 CT1 36 (SUTURE) ×6 IMPLANT
SUT VIC AB 0 CT1 8-18 (SUTURE) ×6
SUT VIC AB 2-0 CT1 (SUTURE) IMPLANT
SUT VIC AB 2-0 SH 27 (SUTURE)
SUT VIC AB 2-0 SH 27XBRD (SUTURE) IMPLANT
SUT VICRYL 1 TIES 12X18 (SUTURE) ×3 IMPLANT
TOWEL GREEN STERILE FF (TOWEL DISPOSABLE) ×6 IMPLANT
TRAY FOLEY W/BAG SLVR 14FR (SET/KITS/TRAYS/PACK) ×3 IMPLANT
UNDERPAD 30X36 HEAVY ABSORB (UNDERPADS AND DIAPERS) ×3 IMPLANT

## 2020-08-14 NOTE — Anesthesia Postprocedure Evaluation (Signed)
Anesthesia Post Note  Patient: Leslie Mueller  Procedure(s) Performed: VAGINAL HYSTERECTOMY WITH BILATERAL SALPINGECTOMY (Bilateral Uterus) CYSTOSCOPY (N/A Bladder)     Patient location during evaluation: PACU Anesthesia Type: General Level of consciousness: awake and alert Pain management: pain level controlled Vital Signs Assessment: post-procedure vital signs reviewed and stable Respiratory status: spontaneous breathing, nonlabored ventilation, respiratory function stable and patient connected to nasal cannula oxygen Cardiovascular status: blood pressure returned to baseline and stable Postop Assessment: no apparent nausea or vomiting Anesthetic complications: no   No complications documented.  Last Vitals:  Vitals:   08/14/20 1117 08/14/20 1221  BP: 111/68 121/62  Pulse: (!) 101 (!) 104  Resp: 17 16  Temp:  36.9 C  SpO2: 99% 100%    Last Pain:  Vitals:   08/14/20 1221  TempSrc: Oral  PainSc:                  Marlow Hendrie S

## 2020-08-14 NOTE — Op Note (Addendum)
08/14/2020  10:12 AM  PATIENT:  Leslie Mueller  45 y.o. female  PRE-OPERATIVE DIAGNOSIS:  Uterine Prolapse  POST-OPERATIVE DIAGNOSIS:  Uterine Prolapse  PROCEDURE:  Procedure(s): VAGINAL HYSTERECTOMY WITH BILATERAL SALPINGECTOMY (Bilateral) CYSTOSCOPY (N/A) Modified McCall's Culdoplasty  SURGEON:  Surgeon(s) and Role:    Gerald Leitz, MD - Primary    Connye Burkitt, Melissa, DO - Assisting due to complexity of the anatomy  PHYSICIAN ASSISTANT:   ASSISTANTS: none   ANESTHESIA:   general  EBL:  50 mL   BLOOD ADMINISTERED:none  DRAINS: Urinary Catheter (Foley)   LOCAL MEDICATIONS USED:  LIDOCAINE   SPECIMEN:  Source of Specimen:  uterus cervix and bilateral fallopian tubes   DISPOSITION OF SPECIMEN:  PATHOLOGY  COUNTS:  YES  TOURNIQUET:  * No tourniquets in log *  DICTATION: .Dragon Dictation  PLAN OF CARE: Admit for overnight observation  PATIENT DISPOSITION:  PACU - hemodynamically stable.   Delay start of Pharmacological VTE agent (>24hrs) due to surgical blood loss or risk of bleeding: not applicable  Findings . Grade 3 uterine prolapse small cystocele.  Normal appearing fallopian tubes and ovaries.   Procedure: The patient was taken to the operating room #10  and  placed under general anesthesia. Prepped and draped in the normal sterile fashion. A foley catheter was placed. A weighted speculum was placed in to the vagina and the cervix was grasped with a toothed tenaculum. The cervix was then injected circumferentially with 1% xylocaine with 1:100K of epinephrine. The cervix was then circumferentially incised with the Bovie and the bladder was dissected off the pubovesical cervical fascia. The anterior cul-de-sac as entered sharply. The same procedure was performed posteriorly and the posterior cu-lde-sac was entered sharply without difficulty. A heany clamp was placed over the uterosacral ligaments bilaterally., These were transected and suture ligated with 0 vicryl.  The cardinal ligaments were then clamped  With the ligasure bilaterally  They were cauterized and transected.   The uterine arteries and the broad ligament were then serially clamped with ligasure cauterized and , transected  bilaterally. Excellent hemostasis was visualized. Both cornu were clamped with heany clamps, transected and the uterus was delivered. Theses pedicles were then suture ligated with excellent hemostasis.  The fallopian tubes were grasped with babcock clamps. The mesosalpinx was clamped with the ligasure, cauterized and transected bilaterally.   A purse string suture of 0 vicryl was placed along the posterior vaginal cuff and posterior peritoneum  And uterosaca ligaments to  Obliterate the posterior culdesac.   The vaginal cuff angles were closed with an angle suture of 0 vicryl and transfixed to the ipsilateral uterosacral ligaments. The remainder of the vaginal cuff was closed with 0 vicryl in a running locked fashion. All instruments were removed from the vagina .   Cystoscopy was performed using 30 Degree cystoscope. Both ureteral orifice were visualized and expressed indigo carmine that was given IV. No bladder injury was noted. The bladder was drained. The patient was taken to the recovery room awake and in stable condition.   Sponge lap and needle counts were correct times 2.

## 2020-08-14 NOTE — Plan of Care (Signed)
Problem: Education: °Goal: Knowledge of General Education information will improve °Description: Including pain rating scale, medication(s)/side effects and non-pharmacologic comfort measures °Outcome: Completed/Met °  °

## 2020-08-14 NOTE — H&P (Signed)
.  Date of Initial H&P: 08/13/2020  History reviewed, patient examined, no change in status, stable for surgery.

## 2020-08-14 NOTE — Anesthesia Procedure Notes (Signed)
Procedure Name: Intubation Date/Time: 08/14/2020 7:54 AM Performed by: Lytle Michaels, CRNA Pre-anesthesia Checklist: Patient identified, Emergency Drugs available, Suction available and Patient being monitored Patient Re-evaluated:Patient Re-evaluated prior to induction Oxygen Delivery Method: Circle system utilized Preoxygenation: Pre-oxygenation with 100% oxygen Induction Type: IV induction Ventilation: Mask ventilation without difficulty Laryngoscope Size: Miller and 2 Grade View: Grade I Tube type: Oral Tube size: 7.0 mm Number of attempts: 1 Airway Equipment and Method: Stylet and Oral airway Placement Confirmation: ETT inserted through vocal cords under direct vision,  positive ETCO2 and breath sounds checked- equal and bilateral Secured at: 21 cm Tube secured with: Tape Dental Injury: Teeth and Oropharynx as per pre-operative assessment

## 2020-08-14 NOTE — Transfer of Care (Signed)
Immediate Anesthesia Transfer of Care Note  Patient: Leslie Mueller  Procedure(s) Performed: VAGINAL HYSTERECTOMY WITH BILATERAL SALPINGECTOMY (Bilateral Uterus) CYSTOSCOPY (N/A Bladder)  Patient Location: PACU  Anesthesia Type:General  Level of Consciousness: awake  Airway & Oxygen Therapy: Patient Spontanous Breathing and Patient connected to face mask oxygen  Post-op Assessment: Report given to RN and Post -op Vital signs reviewed and stable  Post vital signs: Reviewed and stable  Last Vitals:  Vitals Value Taken Time  BP 129/78 08/14/20 0939  Temp    Pulse 108 08/14/20 0941  Resp 18 08/14/20 0941  SpO2 98 % 08/14/20 0941  Vitals shown include unvalidated device data.  Last Pain:  Vitals:   08/14/20 0615  TempSrc:   PainSc: 0-No pain         Complications: No complications documented.

## 2020-08-14 NOTE — Anesthesia Preprocedure Evaluation (Signed)
Anesthesia Evaluation  Patient identified by MRN, date of birth, ID band Patient awake    Reviewed: Allergy & Precautions, H&P , NPO status , Patient's Chart, lab work & pertinent test results  Airway Mallampati: II  TM Distance: >3 FB Neck ROM: Full    Dental no notable dental hx.    Pulmonary neg pulmonary ROS, former smoker,    Pulmonary exam normal breath sounds clear to auscultation       Cardiovascular negative cardio ROS Normal cardiovascular exam Rhythm:Regular Rate:Normal     Neuro/Psych Bipolar Disorder negative neurological ROS     GI/Hepatic negative GI ROS, Neg liver ROS,   Endo/Other  negative endocrine ROS  Renal/GU negative Renal ROS  negative genitourinary   Musculoskeletal negative musculoskeletal ROS (+)   Abdominal   Peds negative pediatric ROS (+)  Hematology negative hematology ROS (+)   Anesthesia Other Findings   Reproductive/Obstetrics negative OB ROS                             Anesthesia Physical Anesthesia Plan  ASA: II  Anesthesia Plan: General   Post-op Pain Management:    Induction: Intravenous  PONV Risk Score and Plan: 3 and Ondansetron, Dexamethasone and Treatment may vary due to age or medical condition  Airway Management Planned: Oral ETT  Additional Equipment:   Intra-op Plan:   Post-operative Plan: Extubation in OR  Informed Consent: I have reviewed the patients History and Physical, chart, labs and discussed the procedure including the risks, benefits and alternatives for the proposed anesthesia with the patient or authorized representative who has indicated his/her understanding and acceptance.     Dental advisory given  Plan Discussed with: CRNA and Surgeon  Anesthesia Plan Comments:         Anesthesia Quick Evaluation

## 2020-08-15 ENCOUNTER — Encounter (HOSPITAL_COMMUNITY): Payer: Self-pay | Admitting: Obstetrics and Gynecology

## 2020-08-15 DIAGNOSIS — N814 Uterovaginal prolapse, unspecified: Secondary | ICD-10-CM | POA: Diagnosis not present

## 2020-08-15 LAB — CBC
HCT: 37.3 % (ref 36.0–46.0)
Hemoglobin: 12.5 g/dL (ref 12.0–15.0)
MCH: 31.6 pg (ref 26.0–34.0)
MCHC: 33.5 g/dL (ref 30.0–36.0)
MCV: 94.2 fL (ref 80.0–100.0)
Platelets: 336 10*3/uL (ref 150–400)
RBC: 3.96 MIL/uL (ref 3.87–5.11)
RDW: 12.4 % (ref 11.5–15.5)
WBC: 20.9 10*3/uL — ABNORMAL HIGH (ref 4.0–10.5)
nRBC: 0 % (ref 0.0–0.2)

## 2020-08-15 LAB — SURGICAL PATHOLOGY

## 2020-08-15 NOTE — Plan of Care (Signed)
Problem: Nutrition: Goal: Adequate nutrition will be maintained Outcome: Completed/Met   Problem: Coping: Goal: Level of anxiety will decrease Outcome: Completed/Met   Problem: Elimination: Goal: Will not experience complications related to urinary retention Outcome: Completed/Met   Problem: Clinical Measurements: Goal: Respiratory complications will improve Outcome: Not Applicable Goal: Cardiovascular complication will be avoided Outcome: Not Applicable

## 2020-08-15 NOTE — Progress Notes (Signed)
GYN Progress Note  Subjective:  Patient feeling well overall this morning.  Reports feeling some pelvic pressure but no significant pain.  Pain is well-controlled. Tolerating regular diet.  Has not had much water overnight or today.  Has been ambulating and has voided.  Passed flatus but no BM. Reports feeling like her heartbeat is a little fast but denies any chest pain or SOB.  Denies fevers, chills, N/V, or bilateral LE edema.  Objective: BP 127/67 (BP Location: Left Arm)   Pulse (!) 115   Temp 98.5 F (36.9 C) (Oral)   Resp 18   Ht _0  (1.626 m)   Wt 98.9 kg   SpO2 98%   BMI 37.42 kg/m  Gen:  NAD, pleasant, resting in bed Cardio:  Mildy tachycardic in 110s, regular rhythm, no murmurs/gallops Lungs:  CTAB, no wheezes/rales/rhonchi Abd:  Soft, non-distended, non-tender to palpation throughout, no rebound/guarding Ext:  No bilateral LE e dema  A/P: POD#1 s/p TVH/BS  - Doing well post-operatively and has met all goals - Voiding spontaneously and tolerating regular diet - Pain well-controlled with Roxi/Ibuprofen - Mildly tachycardic - suspect dehydration, counseled patient to work on drinking plenty of fluids upon discharge home - Counseled patient that if she develops any symptoms of chest pain or SOB to be evaluated immediately  - Disposition:  D/C home today.  Drema Dallas, DO

## 2020-08-15 NOTE — Discharge Summary (Addendum)
Physician Discharge Summary  Patient ID: Leslie Mueller MRN: [id number] DOB/AGE: Nov 14, 1975 45 y.o.  Admit date: 08/14/2020 Discharge date: 08/15/2020  Admission Diagnoses:  Uterine prolapse  Discharge Diagnoses:  Active Problems:   Uterine prolapse   S/P vaginal hysterectomy  Procedures:  Total Vaginal Hysterectomy with Bilateral Salpingectomy, Modified McCall's Culdoplasty, and Cystoscopy  Discharged Condition: good  Hospital Course: Patient was admitted on 08/14/20 for procedures as named above.  She had an unremarkable post-operative course and was meeting all post-operative goals prior to discharge home.  She was discharged home in stable condition.  Consults: None  Significant Diagnostic Studies: labs: CBC - H/H 12.5/37.3 Treatments: surgery: As listed above  Discharge Exam: Blood pressure 127/67, pulse (!) 115, temperature 98.5 F (36.9 C), temperature source Oral, resp. rate 18, height 5\' 4"  (1.626 m), weight 98.9 kg, SpO2 98 %. General appearance: alert, cooperative and appears stated age Resp: clear to auscultation bilaterally Cardio: mildly tachycardic to 110s, no wheezes/rales/rhonchi GI: soft, non-tender; bowel sounds normal; no masses,  no organomegaly Extremities: extremities normal, atraumatic, no cyanosis or edema Skin: Skin color, texture, turgor normal. No rashes or lesions  Disposition: Discharge disposition: 01-Home or Self Care       Discharge Instructions    Call MD for:  persistant nausea and vomiting   Complete by: As directed    Call MD for:  severe uncontrolled pain   Complete by: As directed    Call MD for:  temperature >100.4   Complete by: As directed    Diet general   Complete by: As directed    Driving Restrictions   Complete by: As directed    Avoid driving for 1 week   Increase activity slowly   Complete by: As directed    Lifting restrictions   Complete by: As directed    Avoid lifting over 10 lbs   May shower / Bathe    Complete by: As directed    May walk up steps   Complete by: As directed    No wound care   Complete by: As directed    Sexual Activity Restrictions   Complete by: As directed    Avoid sex     Allergies as of 08/15/2020      Reactions   Lamictal [lamotrigine]    Probably not medicine, coincidental rash w/ external factor      Medication List    TAKE these medications   acetaminophen 500 MG tablet Commonly known as: TYLENOL Take 2 tablets (1,000 mg total) by mouth every 8 (eight) hours as needed.   ALPRAZolam 1 MG tablet Commonly known as: XANAX Take 1 tablet (1 mg total) by mouth 2 (two) times daily as needed for anxiety.   amitriptyline 75 MG tablet Commonly known as: ELAVIL Take 1 tablet (75 mg total) by mouth at bedtime.   ARIPiprazole 5 MG tablet Commonly known as: ABILIFY Take 1 tablet (5 mg total) by mouth daily.   buPROPion 150 MG 24 hr tablet Commonly known as: WELLBUTRIN XL Take 3 tablets (450 mg total) by mouth daily.   ibuprofen 800 MG tablet Commonly known as: ADVIL Take 1 tablet (800 mg total) by mouth every 8 (eight) hours as needed.   multivitamin tablet Take 1 tablet by mouth daily.   multivitamin with minerals tablet Take 1 tablet by mouth daily. Woman's   oxyCODONE 5 MG immediate release tablet Commonly known as: Oxy IR/ROXICODONE Take 1-2 tablets (5-10 mg total) by mouth every 6 (six) hours  as needed for moderate pain or severe pain.       Follow-up Information    Gerald Leitz, MD. Go in 2 week(s).   Specialty: Obstetrics and Gynecology Why:  patient should have a postoperative appt scheduled in 2 weeks  Contact information: [address] [place] [address] [phone]               Signed: Steva Ready 08/15/2020, 7:35 AM

## 2020-08-15 NOTE — Progress Notes (Signed)
Vaginal packing removed as ordered.  Small amount dark vaginal drainage noted on packing.  Patient tolerated well. 

## 2020-08-15 NOTE — Plan of Care (Signed)
  Problem: Health Behavior/Discharge Planning: Goal: Ability to manage health-related needs will improve Outcome: Adequate for Discharge   Problem: Clinical Measurements: Goal: Ability to maintain clinical measurements within normal limits will improve Outcome: Adequate for Discharge Goal: Will remain free from infection Outcome: Adequate for Discharge Goal: Diagnostic test results will improve Outcome: Adequate for Discharge   Problem: Activity: Goal: Risk for activity intolerance will decrease Outcome: Adequate for Discharge   Problem: Elimination: Goal: Will not experience complications related to bowel motility Outcome: Adequate for Discharge   Problem: Pain Managment: Goal: General experience of comfort will improve Outcome: Adequate for Discharge   Problem: Safety: Goal: Ability to remain free from injury will improve Outcome: Adequate for Discharge   Problem: Skin Integrity: Goal: Risk for impaired skin integrity will decrease Outcome: Adequate for Discharge   Problem: Education: Goal: Knowledge of the prescribed therapeutic regimen will improve Outcome: Adequate for Discharge Goal: Understanding of sexual limitations or changes related to disease process or condition will improve Outcome: Adequate for Discharge Goal: Individualized Educational Video(s) Outcome: Adequate for Discharge   Problem: Self-Concept: Goal: Communication of feelings regarding changes in body function or appearance will improve Outcome: Adequate for Discharge   Problem: Skin Integrity: Goal: Demonstration of wound healing without infection will improve Outcome: Adequate for Discharge

## 2021-01-04 IMAGING — MG DIGITAL SCREENING BILAT W/ TOMO W/ CAD
6 of 10 series · 6 of 30 positions shown · non-contrast
Comparison: Previous exam(s).

CLINICAL DATA: Screening.

EXAM:
DIGITAL SCREENING BILATERAL MAMMOGRAM WITH TOMO AND CAD

[R MLO synth-2D (1 of 2)]
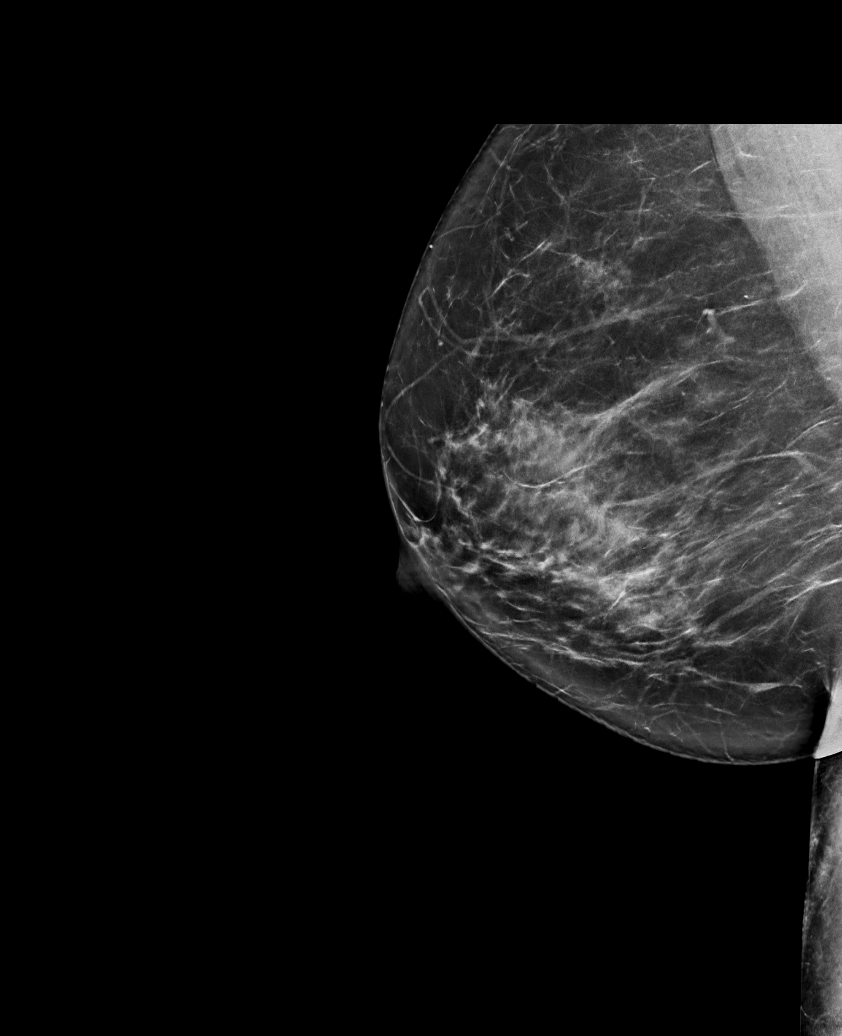

[L MLO synth-2D]
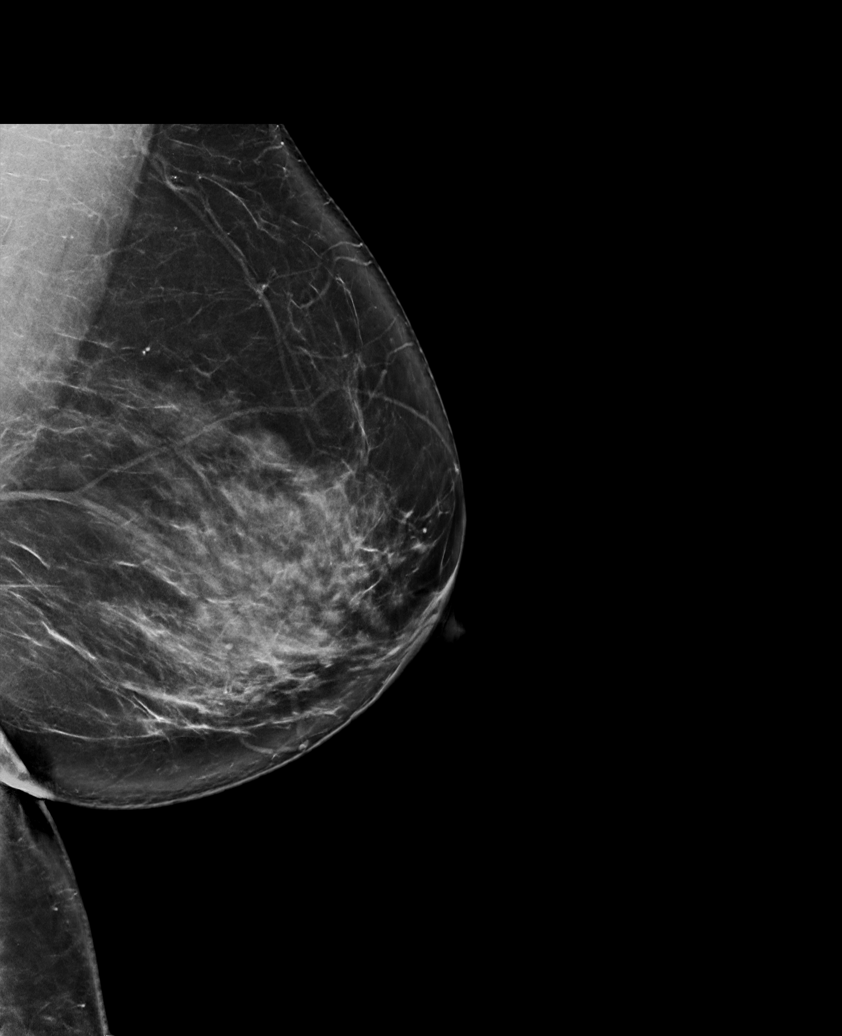

[L CC synth-2D]
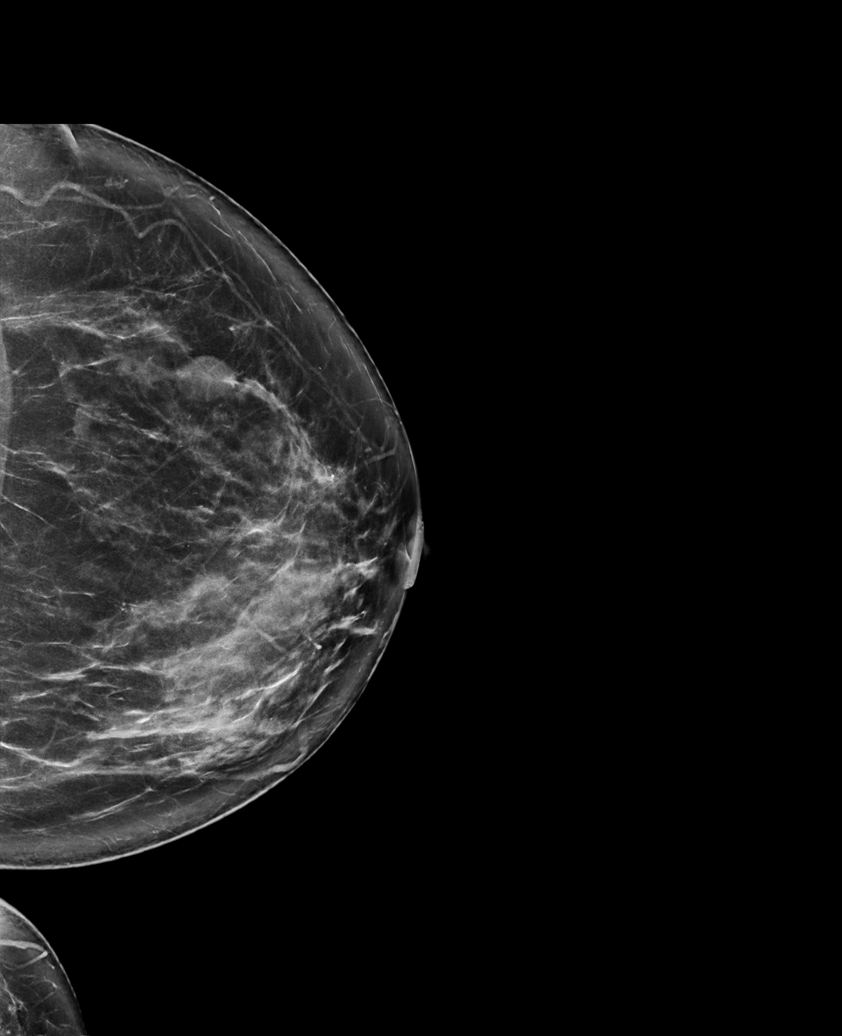

[R MLO synth-2D (2 of 2)]
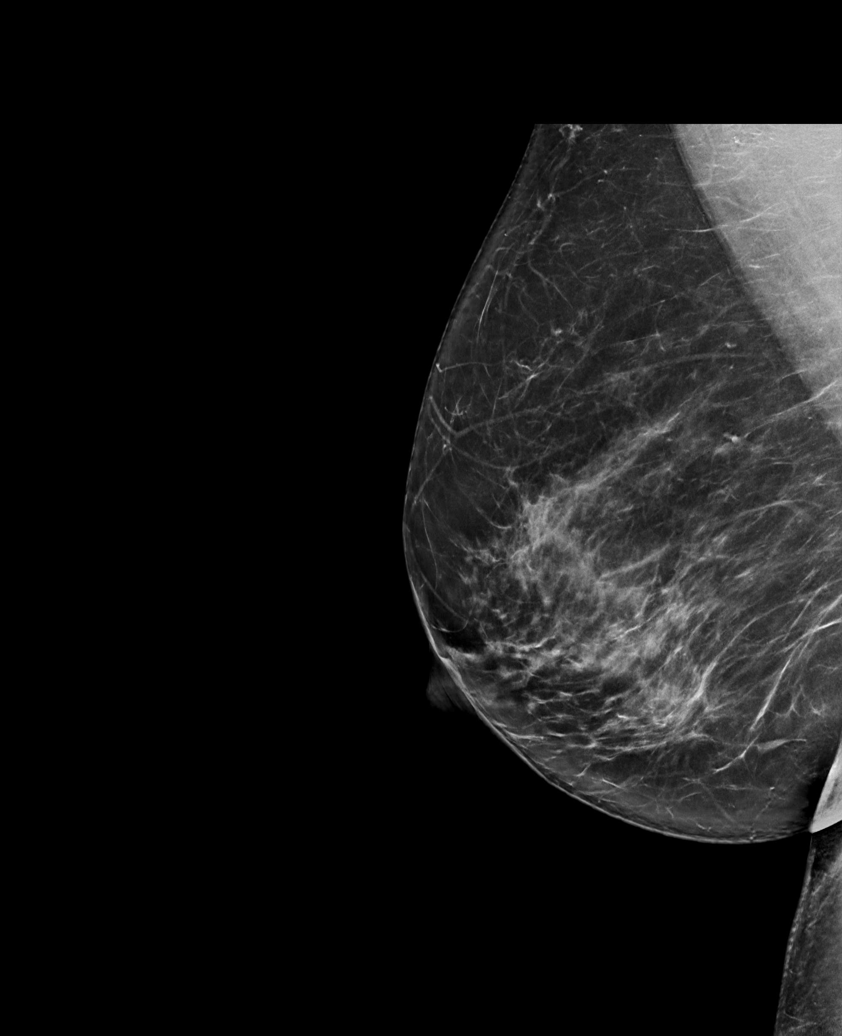

[R CC synth-2D]
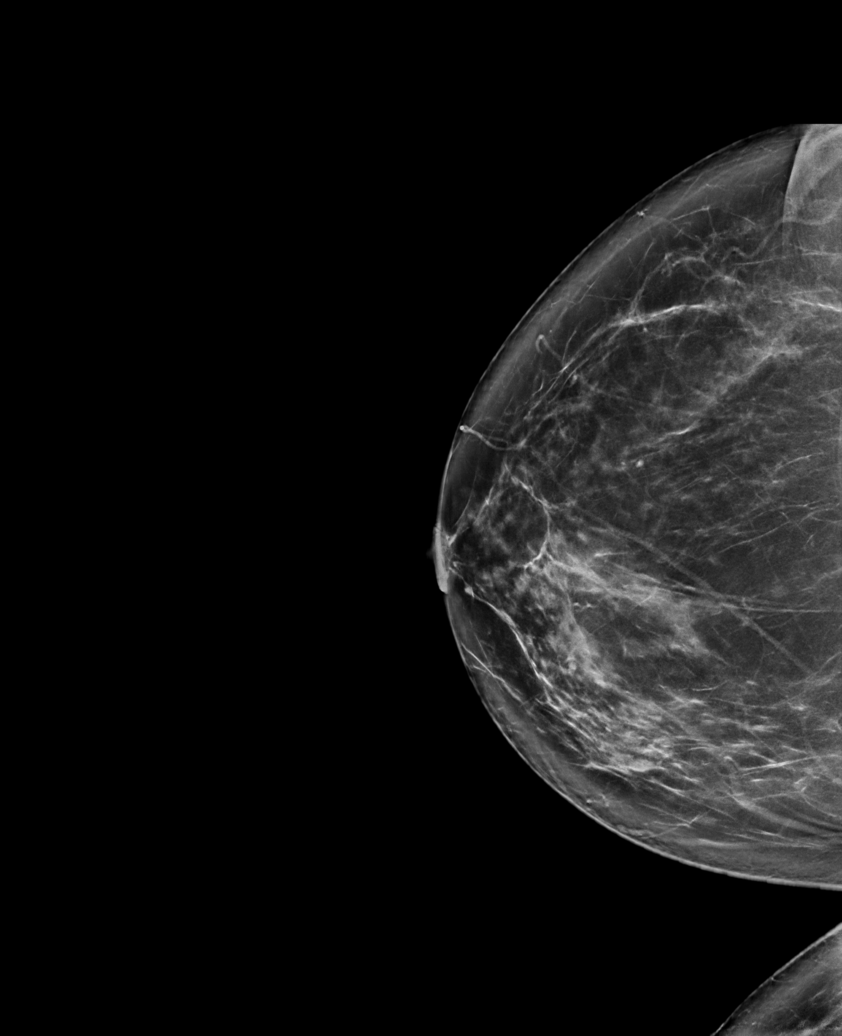

[L MLO tomo · tomo slice 44/87.0]
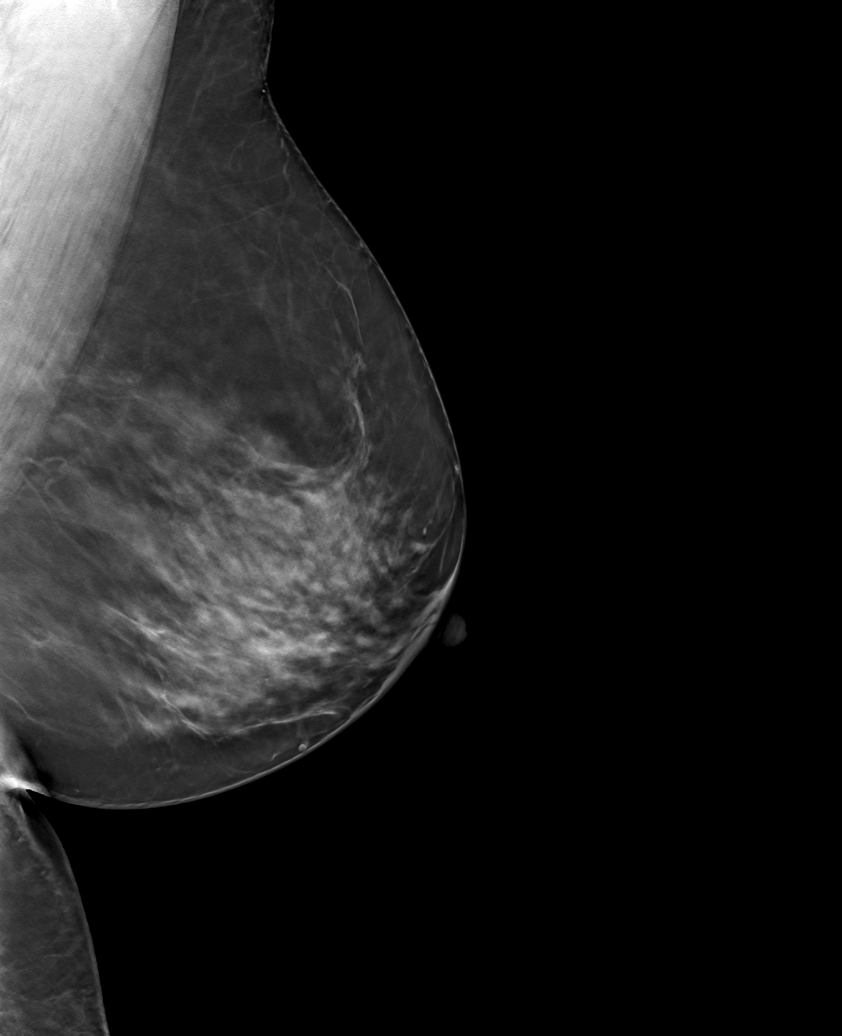

[6 of 30 positions shown; findings below may reference images not displayed]

ACR Breast Density Category c: The breast tissue is heterogeneously
dense, which may obscure small masses.
FINDINGS: There are no findings suspicious for malignancy. Images were
processed with CAD.
IMPRESSION: No mammographic evidence of malignancy. A result letter of this
screening mammogram will be mailed directly to the patient.

RECOMMENDATION:
Screening mammogram in one year. (Code:[id number])

BI-RADS CATEGORY  1: Negative.

## 2021-01-30 ENCOUNTER — Encounter: Payer: Self-pay | Admitting: Physician Assistant

## 2021-01-30 ENCOUNTER — Other Ambulatory Visit: Payer: Self-pay

## 2021-01-30 ENCOUNTER — Ambulatory Visit (INDEPENDENT_AMBULATORY_CARE_PROVIDER_SITE_OTHER): Payer: Self-pay | Admitting: Physician Assistant

## 2021-01-30 DIAGNOSIS — F411 Generalized anxiety disorder: Secondary | ICD-10-CM

## 2021-01-30 DIAGNOSIS — G4700 Insomnia, unspecified: Secondary | ICD-10-CM

## 2021-01-30 DIAGNOSIS — F3342 Major depressive disorder, recurrent, in full remission: Secondary | ICD-10-CM

## 2021-01-30 MED ORDER — BUPROPION HCL ER (XL) 150 MG PO TB24: 450.0000 mg | ORAL_TABLET | Freq: Every day | ORAL | 5 refills | Status: DC

## 2021-01-30 MED ORDER — ALPRAZOLAM 1 MG PO TABS
1.0000 mg | ORAL_TABLET | Freq: Two times a day (BID) | ORAL | 5 refills | Status: DC | PRN
Start: 2021-01-30 — End: 2021-08-11

## 2021-01-30 MED ORDER — ARIPIPRAZOLE 5 MG PO TABS: 5.0000 mg | ORAL_TABLET | Freq: Every day | ORAL | 5 refills | Status: DC

## 2021-01-30 MED ORDER — AMITRIPTYLINE HCL 75 MG PO TABS: 75.0000 mg | ORAL_TABLET | Freq: Every day | ORAL | 5 refills | Status: DC

## 2021-01-30 NOTE — Progress Notes (Signed)
[facility] Med Check  Patient ID: Leslie Mueller,  MRN: [id number]  PCP: Patient, No Pcp Per (Inactive)  Date of Evaluation: 01/30/2021 Time spent:20 minutes  Chief Complaint:  Chief Complaint   Anxiety; Depression; Insomnia; Follow-up      HISTORY/CURRENT STATUS: HPI 6 month med check.  Happy, a lot of good things going on. See SH.  Energy and motivation are good.  She is having a little trouble sleeping right now but we think it may be due to all of the changes in her lifestyle.  She does not isolate.  Does not cry easily.  She does get anxious at times and needs the Xanax.  It is helpful when needed though.  No suicidal or homicidal thoughts.  The only real problem is weight gain.  She eats fairly healthy and exercises.  Abilify may be part of the reason for the weight gain but she has done so well on it neither of [place] want to change it.  States that attention is good without easy distractibility.  Able to focus on things and finish tasks to completion.   Patient denies increased energy with decreased need for sleep, no increased talkativeness, no racing thoughts, no impulsivity or risky behaviors, no increased spending, no increased libido, no grandiosity, no increased irritability or anger, and no hallucinations.  Denies dizziness, syncope, seizures, numbness, tingling, tremor, tics, unsteady gait, slurred speech, confusion. Denies muscle or joint pain, stiffness, or dystonia.  Individual Medical History/ Review of Systems: Changes? :Yes     Past medications for mental health diagnoses include: Depakote, Lamictal, Latuda, Seroquel, melatonin did not help, for regular cause tremor and anxiety, Sonata did not help, Ambien caused her to eat too much, trazodone did not help, Lunesta, Zoloft, Wellbutrin, Restoril did not help, Belsomra    Allergies: Lamictal [lamotrigine]  Current Medications:  Current Outpatient Medications:    acetaminophen (TYLENOL) 500 MG tablet, Take  2 tablets (1,000 mg total) by mouth every 8 (eight) hours as needed., Disp: 30 tablet, Rfl: 0   ibuprofen (ADVIL) 800 MG tablet, Take 1 tablet (800 mg total) by mouth every 8 (eight) hours as needed., Disp: 30 tablet, Rfl: 1   Multiple Vitamin (MULTIVITAMIN) tablet, Take 1 tablet by mouth daily., Disp: , Rfl:    Multiple Vitamins-Minerals (MULTIVITAMIN WITH MINERALS) tablet, Take 1 tablet by mouth daily. Woman's, Disp: , Rfl:    ALPRAZolam (XANAX) 1 MG tablet, Take 1 tablet (1 mg total) by mouth 2 (two) times daily as needed for anxiety., Disp: 60 tablet, Rfl: 5   amitriptyline (ELAVIL) 75 MG tablet, Take 1-1.5 tablets (75-112.5 mg total) by mouth at bedtime., Disp: 45 tablet, Rfl: 5   ARIPiprazole (ABILIFY) 5 MG tablet, Take 1 tablet (5 mg total) by mouth daily., Disp: 30 tablet, Rfl: 5   buPROPion (WELLBUTRIN XL) 150 MG 24 hr tablet, Take 3 tablets (450 mg total) by mouth daily., Disp: 90 tablet, Rfl: 5 Medication Side Effects: weight gain  Family Medical/ Social History: Changes? 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  General Appearance: Casual, Neat, Well Groomed and Obese  Eye Contact:  Good  Speech:  Clear and Coherent and Normal Rate  Volume:  Normal  Mood:  Euthymic  Affect:  Appropriate  Thought Process:  Goal Directed and Descriptions of Associations: Circumstantial  Orientation:  Full (Time, Place, and Person)  Thought Content: Logical   Suicidal Thoughts:  No  Homicidal Thoughts:  No  Memory:  WNL  Judgement:  Good  Insight:  Good  Psychomotor Activity:  Normal  Concentration:  Concentration: Good and Attention Span: Good  Recall:  Good  Fund of Knowledge: Good  Language: Good  Assets:  Desire for Improvement  ADL's:  Intact  Cognition: WNL  Prognosis:  Good    DIAGNOSES:    ICD-10-CM   1.  Recurrent major depressive disorder, in full remission (HCC)  F33.42     2. Generalized anxiety disorder  F41.1     3. Insomnia, unspecified type  G47.00        Receiving Psychotherapy: No    RECOMMENDATIONS:  PDMP was reviewed.  Last Xanax filled 12/24/2020.  I provided 20 minutes of face to face time during this encounter, including time spent before and after the visit in records review, medical decision making, and charting.  I'm glad to see her doing so well! No changes in meds are needed.  Continue Xanax 1 mg, 1 p.o. twice daily as needed. Continue amitriptyline 75 mg to 1 pill up to 1.5 pills nightly as needed sleep.  Only because it has not been helping her sleep as much lately.  We agree that some of it is excitement, anticipation of these new changes.   Continue Abilify 5 mg, 1 p.o. every morning. Continue Wellbutrin XL 150 mg, 3 p.o. daily. Continue multivitamin daily. Return in 6 months.  Melony Overly, PA-C

## 2021-06-02 ENCOUNTER — Other Ambulatory Visit: Payer: Self-pay | Admitting: Physician Assistant

## 2021-06-02 DIAGNOSIS — Z1231 Encounter for screening mammogram for malignant neoplasm of breast: Secondary | ICD-10-CM

## 2021-07-08 ENCOUNTER — Ambulatory Visit: Payer: No Typology Code available for payment source

## 2021-08-11 ENCOUNTER — Encounter: Payer: Self-pay | Admitting: Physician Assistant

## 2021-08-11 ENCOUNTER — Ambulatory Visit: Payer: BC Managed Care – PPO | Admitting: Physician Assistant

## 2021-08-11 DIAGNOSIS — F411 Generalized anxiety disorder: Secondary | ICD-10-CM

## 2021-08-11 DIAGNOSIS — G4700 Insomnia, unspecified: Secondary | ICD-10-CM | POA: Diagnosis not present

## 2021-08-11 DIAGNOSIS — F3342 Major depressive disorder, recurrent, in full remission: Secondary | ICD-10-CM

## 2021-08-11 MED ORDER — ALPRAZOLAM 1 MG PO TABS: 1.0000 mg | ORAL_TABLET | Freq: Two times a day (BID) | ORAL | 5 refills | Status: DC | PRN

## 2021-08-11 MED ORDER — BUPROPION HCL ER (XL) 150 MG PO TB24: 450.0000 mg | ORAL_TABLET | Freq: Every day | ORAL | 1 refills | Status: DC

## 2021-08-11 MED ORDER — AMITRIPTYLINE HCL 75 MG PO TABS: 75.0000 mg | ORAL_TABLET | Freq: Every day | ORAL | 1 refills | Status: DC

## 2021-08-11 MED ORDER — ARIPIPRAZOLE 5 MG PO TABS: 5.0000 mg | ORAL_TABLET | Freq: Every day | ORAL | 1 refills | Status: DC

## 2021-08-11 NOTE — Progress Notes (Signed)
[facility]  Patient ID: Leslie Mueller,  MRN: [id number]  PCP: Patient, No Pcp Per (Inactive)  Date of Evaluation: 08/11/2021  time spent:30 minutes  Chief Complaint:  Chief Complaint   Anxiety; Depression; Follow-up       HISTORY/CURRENT STATUS: HPI 6 month med check.  Doing well.  Feels like her medications are working well at the current doses and does not think we should change anything.  She is able to enjoy things.  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  Energy and motivation are good most of the time.  Sleeps well.  She is only taking the amitriptyline as needed.  Has not been needing it very often lately.  ADLs and personal hygiene are normal.  Not crying easily.  No suicidal or homicidal thoughts.  She has been needing the Xanax a little more often lately, since being in school.  Has more triggers.  Still not taking the Xanax too often and it is effective when needed.  Not really having panic attacks but more of a generalized sense of unease.  Patient denies increased energy with decreased need for sleep, no increased talkativeness, no racing thoughts, no impulsivity or risky behaviors, no increased spending, no increased libido, no grandiosity, no increased irritability or anger, and no hallucinations.   Denies dizziness, syncope, seizures, numbness, tingling, tremor, tics, unsteady gait, slurred speech, confusion. Denies muscle or joint pain, stiffness, or dystonia. Denies unexplained weight loss, frequent infections, or sores that heal slowly.  No polyphagia, polydipsia, or polyuria. Denies visual changes or paresthesias.   Individual Medical History/ Review of Systems: Changes? :Yes   Seasonal allergies. Working out more, eating well and losing weight on purpose.   Past medications for mental health diagnoses include: Depakote, Lamictal, Latuda, Seroquel, melatonin did not  help, for regular cause tremor and anxiety, Sonata did not help, Ambien caused her to eat too much, trazodone did not help, Lunesta, Zoloft, Wellbutrin, Restoril did not help, Belsomra    Allergies: Lamictal [lamotrigine]  Current Medications:  Current Outpatient Medications:    acetaminophen (TYLENOL) 500 MG tablet, Take 2 tablets (1,000 mg total) by mouth every 8 (eight) hours as needed., Disp: 30 tablet, Rfl: 0   ibuprofen (ADVIL) 800 MG tablet, Take 1 tablet (800 mg total) by mouth every 8 (eight) hours as needed., Disp: 30 tablet, Rfl: 1   ALPRAZolam (XANAX) 1 MG tablet, Take 1 tablet (1 mg total) by mouth 2 (two) times daily as needed for anxiety., Disp: 60 tablet, Rfl: 5   amitriptyline (ELAVIL) 75 MG tablet, Take 1-1.5 tablets (75-112.5 mg total) by mouth at bedtime., Disp: 225 tablet, Rfl: 1   ARIPiprazole (ABILIFY) 5 MG tablet, Take 1 tablet (5 mg total) by mouth daily., Disp: 90 tablet, Rfl: 1   buPROPion (WELLBUTRIN XL) 150 MG 24 hr tablet, Take 3 tablets (450 mg total) by mouth daily., Disp: 270 tablet, Rfl: 1   Multiple Vitamin (MULTIVITAMIN) tablet, Take 1 tablet by mouth daily. (Patient not taking: Reported on 08/11/2021), Disp: , Rfl:    Multiple Vitamins-Minerals (MULTIVITAMIN WITH MINERALS) tablet, Take 1 tablet by mouth daily. Woman's (Patient not taking: Reported on 08/11/2021), Disp: , Rfl:  Medication Side Effects: weight gain  Family Medical/ Social History: Changes? 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  MENTAL HEALTH EXAM:  There were no vitals taken for this  visit.There is no height or weight on file to calculate BMI.  General Appearance: Casual, Neat, Well Groomed and Obese  Eye Contact:  Good  Speech:  Clear and Coherent and Normal Rate  Volume:  Normal  Mood:  Euthymic  Affect:  Appropriate  Thought Process:  Goal Directed and Descriptions of Associations: Circumstantial  Orientation:  Full (Time, Place, and  Person)  Thought Content: Logical   Suicidal Thoughts:  No  Homicidal Thoughts:  No  Memory:  WNL  Judgement:  Good  Insight:  Good  Psychomotor Activity:  Normal  Concentration:  Concentration: Good and Attention Span: Good  Recall:  Good  Fund of Knowledge: Good  Language: Good  Assets:  Desire for Improvement Financial Resources/Insurance Housing Transportation Vocational/Educational  ADL's:  Intact  Cognition: WNL  Prognosis:  Good   Labs 06/11/2021, under [facility] through [facility] physicians Glucose 94 Lipid panel: Total cholesterol 213, HDL 74, triglycerides 89, LDL 123  DIAGNOSES:    ICD-10-CM   1. Recurrent major depressive disorder, in full remission (HCC)  F33.42     2. Generalized anxiety disorder  F41.1     3. Insomnia, unspecified type  G47.00         Receiving Psychotherapy: No    RECOMMENDATIONS:  PDMP was reviewed.  Last Xanax filled 04/21/2021.  Phentermine filled 07/17/2021. I provided 30 minutes of face to face time during this encounter, including time spent before and after the visit in records review, medical decision making, counseling pertinent to today's visit, and charting.  I am glad to see her doing well!  No changes in medications are needed.  Continue Xanax 1 mg, 1 p.o. twice daily as needed. Continue amitriptyline 75 mg to 1 pill up to 1.5 pills nightly as needed sleep.  Continue Abilify 5 mg, 1 p.o. every morning. Continue Wellbutrin XL 150 mg, 3 p.o. daily. Continue multivitamin daily. Return in 6 months.  Melony Overly, PA-C

## 2022-01-27 ENCOUNTER — Ambulatory Visit (INDEPENDENT_AMBULATORY_CARE_PROVIDER_SITE_OTHER): Payer: Self-pay | Admitting: Physician Assistant

## 2022-01-27 DIAGNOSIS — Z91199 Patient's noncompliance with other medical treatment and regimen due to unspecified reason: Secondary | ICD-10-CM

## 2022-01-27 NOTE — Progress Notes (Signed)
   Complete physical exam  Patient: Leslie Mueller   DOB: 04/11/1999   46 y.o. Female  MRN: [id number]  Subjective:    No chief complaint on file.   Leslie Mueller is a 46 y.o. female who presents today for a complete physical exam. She reports consuming a {diet types:17450} diet. {types:19826} She generally feels {DESC; WELL/FAIRLY WELL/POORLY:18703}. She reports sleeping {DESC; WELL/FAIRLY WELL/POORLY:18703}. She {does/does not:200015} have additional problems to discuss today.    Most recent fall risk assessment:    12/17/2021   10:42 AM  Fall Risk   Falls in the past year? 0  Number falls in past yr: 0  Injury with Fall? 0  Risk for fall due to : No Fall Risks  Follow up Falls evaluation completed     Most recent depression screenings:    12/17/2021   10:42 AM 11/07/2020   10:46 AM  PHQ 2/9 Scores  PHQ - 2 Score 0 0  PHQ- 9 Score 5     {VISON DENTAL STD PSA (Optional):27386}  {History (Optional):23778}  Patient Care Team: Jessup, Joy, NP as PCP - General (Nurse Practitioner)   Outpatient Medications Prior to Visit  Medication Sig   fluticasone (FLONASE) 50 MCG/ACT nasal spray Place 2 sprays into both nostrils in the morning and at bedtime. After 7 days, reduce to once daily.   norgestimate-ethinyl estradiol (SPRINTEC 28) 0.25-35 MG-MCG tablet Take 1 tablet by mouth daily.   Nystatin POWD Apply liberally to affected area 2 times per day   spironolactone (ALDACTONE) 100 MG tablet Take 1 tablet (100 mg total) by mouth daily.   No facility-administered medications prior to visit.    ROS        Objective:     There were no vitals taken for this visit. {Vitals History (Optional):23777}  Physical Exam   No results found for any visits on 01/22/22. {Show previous labs (optional):23779}    Assessment & Plan:    Routine Health Maintenance and Physical Exam  Immunization History  Administered Date(s) Administered   DTaP 06/25/1999, 08/21/1999,  10/30/1999, 07/15/2000, 01/29/2004   Hepatitis A 11/25/2007, 11/30/2008   Hepatitis B 04/12/1999, 05/20/1999, 10/30/1999   HiB (PRP-OMP) 06/25/1999, 08/21/1999, 10/30/1999, 07/15/2000   IPV 06/25/1999, 08/21/1999, 04/19/2000, 01/29/2004   Influenza,inj,Quad PF,6+ Mos 03/02/2014   Influenza-Unspecified 06/01/2012   MMR 04/19/2001, 01/29/2004   Meningococcal Polysaccharide 11/30/2011   Pneumococcal Conjugate-13 07/15/2000   Pneumococcal-Unspecified 10/30/1999, 01/13/2000   Tdap 11/30/2011   Varicella 04/19/2000, 11/25/2007    Health Maintenance  Topic Date Due   HIV Screening  Never done   Hepatitis C Screening  Never done   INFLUENZA VACCINE  01/20/2022   PAP-Cervical Cytology Screening  01/22/2022 (Originally 04/10/2020)   PAP SMEAR-Modifier  01/22/2022 (Originally 04/10/2020)   TETANUS/TDAP  01/22/2022 (Originally 11/29/2021)   HPV VACCINES  Discontinued   COVID-19 Vaccine  Discontinued    Discussed health benefits of physical activity, and encouraged her to engage in regular exercise appropriate for her age and condition.  Problem List Items Addressed This Visit   None Visit Diagnoses     Annual physical exam    -  Primary   Cervical cancer screening       Need for Tdap vaccination          No follow-ups on file.     Joy Jessup, NP   

## 2022-03-13 ENCOUNTER — Other Ambulatory Visit: Payer: Self-pay | Admitting: Physician Assistant

## 2022-03-13 NOTE — Telephone Encounter (Signed)
Please schedule appt

## 2022-03-14 ENCOUNTER — Other Ambulatory Visit: Payer: Self-pay | Admitting: Physician Assistant

## 2022-03-16 NOTE — Telephone Encounter (Signed)
Pt lvm confirming this pharmacy Pt has an appt on 10/31

## 2022-03-23 ENCOUNTER — Other Ambulatory Visit: Payer: Self-pay

## 2022-03-23 NOTE — Telephone Encounter (Signed)
Addendum to attached message. Next appt is 04/28/22. Requesting refill on Xanax called to:  [id number] - [place], [place]  Phone:  [phone]  Fax:  [phone]

## 2022-04-21 ENCOUNTER — Ambulatory Visit: Payer: BC Managed Care – PPO | Admitting: Physician Assistant

## 2022-04-28 ENCOUNTER — Ambulatory Visit: Payer: BC Managed Care – PPO | Admitting: Physician Assistant

## 2022-06-04 ENCOUNTER — Ambulatory Visit: Payer: BC Managed Care – PPO | Admitting: Physician Assistant

## 2022-06-05 ENCOUNTER — Other Ambulatory Visit: Payer: Self-pay | Admitting: Physician Assistant

## 2022-06-05 NOTE — Telephone Encounter (Signed)
Xanax filled 10/2

## 2022-07-16 ENCOUNTER — Ambulatory Visit (INDEPENDENT_AMBULATORY_CARE_PROVIDER_SITE_OTHER): Payer: Self-pay | Admitting: Physician Assistant

## 2022-07-16 DIAGNOSIS — Z91199 Patient's noncompliance with other medical treatment and regimen due to unspecified reason: Secondary | ICD-10-CM

## 2022-07-16 NOTE — Progress Notes (Signed)
No show

## 2022-08-20 ENCOUNTER — Other Ambulatory Visit: Payer: Self-pay

## 2022-08-20 ENCOUNTER — Other Ambulatory Visit: Payer: Self-pay | Admitting: Physician Assistant

## 2022-08-20 ENCOUNTER — Telehealth: Payer: Self-pay | Admitting: Physician Assistant

## 2022-08-20 MED ORDER — BUPROPION HCL ER (XL) 150 MG PO TB24: 450.0000 mg | ORAL_TABLET | Freq: Every day | ORAL | 0 refills | Status: DC

## 2022-08-20 MED ORDER — AMITRIPTYLINE HCL 75 MG PO TABS: ORAL_TABLET | ORAL | 0 refills | Status: DC

## 2022-08-20 NOTE — Telephone Encounter (Signed)
Pended or sent.

## 2022-08-20 NOTE — Telephone Encounter (Signed)
Pt called and made an appointment for 09/22/22. She needs refills on her  xanax 1 mg, wellbutrin xl 150 mg and her amitriptyline 75 mg. [facility] on [address]

## 2022-09-03 ENCOUNTER — Other Ambulatory Visit: Payer: Self-pay | Admitting: Physician Assistant

## 2022-09-15 ENCOUNTER — Other Ambulatory Visit: Payer: Self-pay | Admitting: Physician Assistant

## 2022-09-22 ENCOUNTER — Ambulatory Visit (INDEPENDENT_AMBULATORY_CARE_PROVIDER_SITE_OTHER): Payer: BC Managed Care – PPO | Admitting: Physician Assistant

## 2022-09-22 ENCOUNTER — Encounter: Payer: Self-pay | Admitting: Physician Assistant

## 2022-09-22 DIAGNOSIS — F411 Generalized anxiety disorder: Secondary | ICD-10-CM

## 2022-09-22 DIAGNOSIS — Z79899 Other long term (current) drug therapy: Secondary | ICD-10-CM | POA: Diagnosis not present

## 2022-09-22 DIAGNOSIS — G4700 Insomnia, unspecified: Secondary | ICD-10-CM

## 2022-09-22 DIAGNOSIS — F3342 Major depressive disorder, recurrent, in full remission: Secondary | ICD-10-CM | POA: Diagnosis not present

## 2022-09-22 MED ORDER — AMITRIPTYLINE HCL 75 MG PO TABS: ORAL_TABLET | ORAL | 5 refills | Status: DC

## 2022-09-22 MED ORDER — ARIPIPRAZOLE 5 MG PO TABS: 5.0000 mg | ORAL_TABLET | Freq: Every day | ORAL | 5 refills | Status: DC

## 2022-09-22 MED ORDER — ALPRAZOLAM 1 MG PO TABS: ORAL_TABLET | ORAL | 5 refills | Status: DC

## 2022-09-22 MED ORDER — BUPROPION HCL ER (XL) 150 MG PO TB24: 450.0000 mg | ORAL_TABLET | Freq: Every day | ORAL | 5 refills | Status: DC

## 2022-09-22 NOTE — Progress Notes (Unsigned)
[facility]  Patient ID: RAELYNN BLOT,  MRN: [id number]  PCP: Patient, No Pcp Per  Date of Evaluation: 09/22/2022 time spent:30 minutes  Chief Complaint:  Chief Complaint   Follow-up    HISTORY/CURRENT STATUS: HPI 6 months overdue for routine med check.  States she is doing very well.  She is busy with work, she is an Environmental consultant principal. Patient is able to enjoy things.  Energy and motivation are good.  No extreme sadness, tearfulness, or feelings of hopelessness.  Sleeps well most of the time. ADLs and personal hygiene are normal.   Denies any changes in concentration, making decisions, or remembering things.  Appetite has not changed.  Weight is stable.  Still suffers from anxiety at times.  Takes the Xanax to help when she gets overwhelmed.  It is still effective.  Not having panic attacks. She is exercising fairly routinely and eating well.  Denies suicidal or homicidal thoughts.  Patient denies increased energy with decreased need for sleep, increased talkativeness, racing thoughts, impulsivity or risky behaviors, increased spending, increased libido, grandiosity, increased irritability or anger, paranoia, or hallucinations.  Denies dizziness, syncope, seizures, numbness, tingling, tremor, tics, unsteady gait, slurred speech, confusion. Denies muscle or joint pain, stiffness, or dystonia. Denies unexplained weight loss, frequent infections, or sores that heal slowly.  No polyphagia, polydipsia, or polyuria. Denies visual changes or paresthesias.   Individual Medical History/ Review of Systems: Changes? :No      Past medications for mental health diagnoses include: Depakote, Lamictal, Latuda, Seroquel, melatonin did not help, for regular cause tremor and anxiety, Sonata did not help, Ambien caused her to eat too much, trazodone did not help, Lunesta, Zoloft, Wellbutrin, Restoril did not help, Belsomra    Allergies: Lamictal [lamotrigine]  Current Medications:   Current Outpatient Medications:    acetaminophen (TYLENOL) 500 MG tablet, Take 2 tablets (1,000 mg total) by mouth every 8 (eight) hours as needed., Disp: 30 tablet, Rfl: 0   ALPRAZolam (XANAX) 1 MG tablet, TAKE ONE TABLET BY MOUTH TWICE A DAY AS NEEDED FOR ANXIETY, Disp: 60 tablet, Rfl: 5   amitriptyline (ELAVIL) 75 MG tablet, TAKE 1 TO 1 AND 1/2 TABLETS EVERY NIGHT AT BEDTIME AS DIRECTED, Disp: 45 tablet, Rfl: 5   ARIPiprazole (ABILIFY) 5 MG tablet, Take 1 tablet (5 mg total) by mouth daily., Disp: 30 tablet, Rfl: 5   buPROPion (WELLBUTRIN XL) 150 MG 24 hr tablet, Take 3 tablets (450 mg total) by mouth daily., Disp: 90 tablet, Rfl: 5   ibuprofen (ADVIL) 800 MG tablet, Take 1 tablet (800 mg total) by mouth every 8 (eight) hours as needed., Disp: 30 tablet, Rfl: 1   Multiple Vitamin (MULTIVITAMIN) tablet, Take 1 tablet by mouth daily. (Patient not taking: Reported on 08/11/2021), Disp: , Rfl:    Multiple Vitamins-Minerals (MULTIVITAMIN WITH MINERALS) tablet, Take 1 tablet by mouth daily. Woman's (Patient not taking: Reported on 08/11/2021), Disp: , Rfl:  Medication Side Effects: weight gain  Family Medical/ Social History: Changes? None  MENTAL HEALTH EXAM:  There were no vitals taken for this visit.There is no height or weight on file to calculate BMI.  General Appearance: Casual, Well Groomed, and Obese  Eye Contact:  Good  Speech:  Clear and Coherent and Normal Rate  Volume:  Normal  Mood:  Euthymic  Affect:  Appropriate  Thought Process:  Goal Directed and Descriptions of Associations: Circumstantial  Orientation:  Full (Time, Place, and Person)  Thought Content: Logical   Suicidal Thoughts:  No  Homicidal Thoughts:  No  Memory:  WNL  Judgement:  Good  Insight:  Good  Psychomotor Activity:  Normal  Concentration:  Concentration: Good and Attention Span: Good  Recall:  Good  Fund of Knowledge: Good  Language: Good  Assets:  Desire for Improvement Financial  Resources/Insurance Housing Transportation Vocational/Educational  ADL's:  Intact  Cognition: WNL  Prognosis:  Good   DIAGNOSES:    ICD-10-CM   1. Recurrent major depressive disorder, in full remission  F33.42 Hemoglobin A1c    Lipid panel    Basic metabolic panel    2. Generalized anxiety disorder  F41.1     3. Insomnia, unspecified type  G47.00     4. Encounter for long-term (current) use of medications  Z79.899 Hemoglobin A1c    Lipid panel    Basic metabolic panel     Receiving Psychotherapy: No   RECOMMENDATIONS:  PDMP was reviewed.  Last Xanax filled 08/21/2022. I provided 30 minutes of face to face time during this encounter, including time spent before and after the visit in records review, medical decision making, counseling pertinent to today's visit, and charting.   I am glad to see her doing well!  No changes needed in medications. She has not had labs to monitor signs of metabolic syndrome so I am ordering those.  Have them drawn sometime within the next few months, needs to be fasting.  Continue Xanax 1 mg, 1 p.o. twice daily as needed. Continue amitriptyline 75 mg to 1 pill up to 1.5 pills nightly as needed sleep.  Continue Abilify 5 mg, 1 p.o. every morning. Continue Wellbutrin XL 150 mg, 3 p.o. daily. Continue multivitamin daily. Return in 6 months.  Donnal Moat, PA-C

## 2022-12-14 ENCOUNTER — Encounter: Payer: Self-pay | Admitting: Physician Assistant

## 2022-12-14 DIAGNOSIS — Z1231 Encounter for screening mammogram for malignant neoplasm of breast: Secondary | ICD-10-CM

## 2023-03-25 ENCOUNTER — Ambulatory Visit: Payer: BC Managed Care – PPO | Admitting: Physician Assistant

## 2023-04-12 ENCOUNTER — Ambulatory Visit: Payer: BC Managed Care – PPO | Admitting: Physician Assistant

## 2023-04-22 ENCOUNTER — Other Ambulatory Visit: Payer: Self-pay | Admitting: Physician Assistant

## 2023-04-23 NOTE — Telephone Encounter (Signed)
Lf 09/274; lv 04/2; pt no showed 10/3 and 10/21 has an appt 11/11

## 2023-05-03 ENCOUNTER — Ambulatory Visit (INDEPENDENT_AMBULATORY_CARE_PROVIDER_SITE_OTHER): Payer: Self-pay | Admitting: Physician Assistant

## 2023-05-03 DIAGNOSIS — Z91199 Patient's noncompliance with other medical treatment and regimen due to unspecified reason: Secondary | ICD-10-CM

## 2023-05-03 NOTE — Progress Notes (Signed)
No show

## 2023-05-19 ENCOUNTER — Other Ambulatory Visit: Payer: Self-pay | Admitting: Physician Assistant

## 2023-05-19 NOTE — Telephone Encounter (Signed)
Pt is scheduled on 06/27/22

## 2023-05-19 NOTE — Telephone Encounter (Signed)
PLEASE SCHEDULE PT APPT.

## 2023-06-14 ENCOUNTER — Other Ambulatory Visit: Payer: Self-pay | Admitting: Physician Assistant

## 2023-06-20 ENCOUNTER — Other Ambulatory Visit: Payer: Self-pay | Admitting: Physician Assistant

## 2023-06-25 ENCOUNTER — Other Ambulatory Visit: Payer: Self-pay | Admitting: Physician Assistant

## 2023-06-28 ENCOUNTER — Ambulatory Visit: Payer: 59 | Admitting: Physician Assistant

## 2023-06-28 ENCOUNTER — Encounter: Payer: Self-pay | Admitting: Physician Assistant

## 2023-06-28 DIAGNOSIS — G4700 Insomnia, unspecified: Secondary | ICD-10-CM | POA: Diagnosis not present

## 2023-06-28 DIAGNOSIS — F411 Generalized anxiety disorder: Secondary | ICD-10-CM

## 2023-06-28 DIAGNOSIS — R454 Irritability and anger: Secondary | ICD-10-CM

## 2023-06-28 MED ORDER — BUPROPION HCL ER (XL) 150 MG PO TB24: 450.0000 mg | ORAL_TABLET | Freq: Every day | ORAL | 0 refills | Status: DC

## 2023-06-28 MED ORDER — ALPRAZOLAM 1 MG PO TABS: ORAL_TABLET | ORAL | 5 refills | Status: DC

## 2023-06-28 MED ORDER — ARIPIPRAZOLE 10 MG PO TABS: 10.0000 mg | ORAL_TABLET | Freq: Every day | ORAL | 0 refills | Status: DC

## 2023-06-28 MED ORDER — AMITRIPTYLINE HCL 150 MG PO TABS: 150.0000 mg | ORAL_TABLET | Freq: Every day | ORAL | 0 refills | Status: DC

## 2023-06-28 NOTE — Progress Notes (Signed)
 [facility]  Patient ID: Leslie Mueller,  MRN: [id number]  PCP: Patient, No Pcp Per  Date of Evaluation: 06/28/2023 time spent: 32 minutes  Chief Complaint:  Chief Complaint   Anxiety; Depression; Insomnia; Follow-up    HISTORY/CURRENT STATUS: HPI overdue for routine med check.  Her mom is in the hospital, had not been doing well for the past few days but better today.  She was severely anemic.  She will have an EGD and colonoscopy within the next few days to try to figure out where the bleeding is coming from.    For the past few months she has been more irritable.  Impulsively buying things, especially close and shoes.  Has racing thoughts.  Denies increased energy with decreased need for sleep.  In fact she is not sleeping as well as she once did.  The amitriptyline  is not as effective as it was.  No paranoia.  No increased libido.  No hallucinations.  Patient is able to enjoy things.  Energy and motivation are good.  Work is going well.   No extreme sadness, tearfulness, or feelings of hopelessness.  ADLs and personal hygiene are normal.   Denies any changes in concentration, making decisions, or remembering things.  Appetite has not changed.  Weight is stable.  Anxiety is well controlled with the Xanax .  She does not have panic attacks but gets overwhelmed easily.  Denies suicidal or homicidal thoughts.  Denies dizziness, syncope, seizures, numbness, tingling, tremor, tics, unsteady gait, slurred speech, confusion. Denies muscle or joint pain, stiffness, or dystonia. Denies unexplained weight loss, frequent infections, or sores that heal slowly.  No polyphagia, polydipsia, or polyuria. Denies visual changes or paresthesias.   Individual Medical History/ Review of Systems: Changes? :No      Past medications for mental health diagnoses include: Depakote, Lamictal, Latuda, Seroquel, melatonin did not help, for regular cause tremor and anxiety, Sonata did not help, Ambien  caused her to eat too much, trazodone did not help, Lunesta , Zoloft, Wellbutrin , Restoril did not help, Belsomra    Allergies: Lamictal [lamotrigine]  Current Medications:  Current Outpatient Medications:    acetaminophen  (TYLENOL ) 500 MG tablet, Take 2 tablets (1,000 mg total) by mouth every 8 (eight) hours as needed., Disp: 30 tablet, Rfl: 0   ibuprofen  (ADVIL ) 800 MG tablet, Take 1 tablet (800 mg total) by mouth every 8 (eight) hours as needed., Disp: 30 tablet, Rfl: 1   ALPRAZolam  (XANAX ) 1 MG tablet, TAKE ONE TABLET BY MOUTH TWICE A DAY AS NEEDED FOR ANXIETY, Disp: 60 tablet, Rfl: 5   amitriptyline  (ELAVIL ) 150 MG tablet, Take 1 tablet (150 mg total) by mouth at bedtime., Disp: 90 tablet, Rfl: 0   ARIPiprazole  (ABILIFY ) 10 MG tablet, Take 1 tablet (10 mg total) by mouth daily., Disp: 90 tablet, Rfl: 0   buPROPion  (WELLBUTRIN  XL) 150 MG 24 hr tablet, Take 3 tablets (450 mg total) by mouth daily., Disp: 270 tablet, Rfl: 0   Multiple Vitamin (MULTIVITAMIN) tablet, Take 1 tablet by mouth daily. (Patient not taking: Reported on 08/11/2021), Disp: , Rfl:    Multiple Vitamins-Minerals (MULTIVITAMIN WITH MINERALS) tablet, Take 1 tablet by mouth daily. Woman's (Patient not taking: Reported on 08/11/2021), Disp: , Rfl:  Medication Side Effects: weight gain  Family Medical/ Social History: Changes? None  MENTAL HEALTH EXAM:  There were no vitals taken for this visit.There is no height or weight on file to calculate BMI.  General Appearance: Casual and Well Groomed  Eye Contact:  Good  Speech:  Clear and Coherent and Normal Rate  Volume:  Normal  Mood:  Euthymic  Affect:  Appropriate  Thought Process:  Goal Directed and Descriptions of Associations: Circumstantial  Orientation:  Full (Time, Place, and Person)  Thought Content: Logical   Suicidal Thoughts:  No  Homicidal Thoughts:  No  Memory:  WNL  Judgement:  Good  Insight:  Good  Psychomotor Activity:  Normal  Concentration:   Concentration: Good and Attention Span: Good  Recall:  Good  Fund of Knowledge: Good  Language: Good  Assets:  Desire for Improvement Financial Resources/Insurance Housing Transportation Vocational/Educational  ADL's:  Intact  Cognition: WNL  Prognosis:  Good   December 04, 2022 at [facility] physicians Glucose 85 Cholesterol 197 <200 mg/dL    CHOL/HDL 2.7 7.9-5.9 Ratio    HDLD 72 30-85 mg/dL Values below 40 mg/dL indicate increased risk factor  Triglyceride 115 0-199 mg/dL    NHDL 874 9-870 mg/dL Range dependent upon risk factors.  LDL Chol Calc (NIH) 105 0-99 mg/dL     DIAGNOSES:    PRI-89-RF   1. Generalized anxiety disorder  F41.1     2. Insomnia, unspecified type  G47.00     3. Irritability and anger  R45.4      Receiving Psychotherapy: No   RECOMMENDATIONS:  PDMP was reviewed.  Last Xanax  filled 04/23/2023. I provided 32 minutes of face to face time during this encounter, including time spent before and after the visit in records review, medical decision making, counseling pertinent to today's visit, and charting.   I recommend increasing the amitriptyline  to help with sleep and depression.  Also recommend increasing Abilify .  She would like to try these changes.  Continue Xanax  1 mg, 1 p.o. twice daily as needed. Increase amitriptyline  to 150 mg nightly. Increase Abilify  to 10 mg every morning.   Continue Wellbutrin  XL 150 mg, 3 p.o. daily. Continue multivitamin daily. Return in 6 weeks.  Verneita Cooks, PA-C

## 2023-07-16 ENCOUNTER — Telehealth: Payer: 59 | Admitting: Physician Assistant

## 2023-07-21 ENCOUNTER — Telehealth: Payer: Self-pay | Admitting: Physician Assistant

## 2023-07-21 NOTE — Telephone Encounter (Signed)
Pt called at 10:58a to try to schedule an appt with Rosey Bath for today or tomorrow.  She is asking for note for work.  She had appt scheduled for early march and said she cancelled it last week she said because she thought she could be seen before that (but she didn't make another appt at that time).  Now Teresa's next available is March 17.  I have also added her to the wait list.  I didn't know if Rosey Bath would write her a note before her visit.

## 2023-07-21 NOTE — Telephone Encounter (Signed)
Leslie Mueller said that she is resigning from her job, but in order to use her sick days she needs a note from you stating that she is seen for mental health issues. From Feb 1-Feb 28.  She is overwhelmed with her friend passing away and her mother just got out the hospital and she has to take care of her mother.   Please advise.

## 2023-07-21 NOTE — Telephone Encounter (Signed)
Yes, I'll write a letter, stating she needs to be out d/t uncontrolled mental health sx. Does it need to be addressed 'to whom it may concern' or who? And should it say she'll be out beginning 07/24/2023 and may return on 08/21/2023? Is she going to pick it up?

## 2023-07-21 NOTE — Telephone Encounter (Signed)
To whom it may concern is fine. And out 2-1 to 2-28. She will not be returning.  She will pu when ready.

## 2023-07-22 NOTE — Telephone Encounter (Signed)
Letter was dictated and sent to you both.

## 2023-07-25 ENCOUNTER — Other Ambulatory Visit: Payer: Self-pay | Admitting: Physician Assistant

## 2023-08-25 ENCOUNTER — Telehealth: Payer: 59 | Admitting: Physician Assistant

## 2023-08-27 ENCOUNTER — Other Ambulatory Visit: Payer: Self-pay | Admitting: Physician Assistant

## 2023-09-06 ENCOUNTER — Ambulatory Visit: Payer: 59 | Admitting: Physician Assistant

## 2023-09-20 ENCOUNTER — Other Ambulatory Visit: Payer: Self-pay | Admitting: Physician Assistant

## 2023-09-23 ENCOUNTER — Other Ambulatory Visit: Payer: Self-pay | Admitting: Physician Assistant

## 2023-10-19 ENCOUNTER — Other Ambulatory Visit: Payer: Self-pay | Admitting: Physician Assistant

## 2023-10-28 ENCOUNTER — Ambulatory Visit: Payer: Self-pay | Admitting: Physician Assistant

## 2023-11-03 ENCOUNTER — Ambulatory Visit: Payer: Self-pay | Admitting: Physician Assistant

## 2023-11-03 ENCOUNTER — Encounter: Payer: Self-pay | Admitting: Physician Assistant

## 2023-11-03 DIAGNOSIS — G4700 Insomnia, unspecified: Secondary | ICD-10-CM

## 2023-11-03 DIAGNOSIS — F411 Generalized anxiety disorder: Secondary | ICD-10-CM

## 2023-11-03 DIAGNOSIS — F3342 Major depressive disorder, recurrent, in full remission: Secondary | ICD-10-CM

## 2023-11-03 MED ORDER — AMITRIPTYLINE HCL 150 MG PO TABS
150.0000 mg | ORAL_TABLET | Freq: Every evening | ORAL | 1 refills | Status: DC | PRN
Start: 2023-11-03 — End: 2024-05-03

## 2023-11-03 MED ORDER — BUPROPION HCL ER (XL) 150 MG PO TB24: 450.0000 mg | ORAL_TABLET | Freq: Every day | ORAL | 1 refills | Status: DC

## 2023-11-03 MED ORDER — CLONAZEPAM 1 MG PO TABS: 1.0000 mg | ORAL_TABLET | Freq: Two times a day (BID) | ORAL | 1 refills | Status: DC | PRN

## 2023-11-03 MED ORDER — ARIPIPRAZOLE 15 MG PO TABS: 15.0000 mg | ORAL_TABLET | Freq: Every day | ORAL | 1 refills | Status: DC

## 2023-11-03 NOTE — Progress Notes (Signed)
 [facility]  Patient ID: SHALETHA TRA,  MRN: [id number]  PCP: Patient, No Pcp Per  Date of Evaluation: 11/03/2023 Time spent:20 minutes  Chief Complaint:  Chief Complaint   Depression; Follow-up    HISTORY/CURRENT STATUS: HPI  for routine med check.   Increasing the amitriptyline  at the last visit was helpful.  She does not take it every night because she is able to sleep without it.  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  This has been a dream of hers for years.  She is so tired some nights after getting off work that she is able to fall asleep immediately.    States her mood is good. Patient is able to enjoy things.  Energy and motivation are good.  No extreme sadness, tearfulness, or feelings of hopelessness.   ADLs and personal hygiene are normal.   Denies any changes in concentration, making decisions, or remembering things.  Appetite has not changed.  Weight is stable.  Denies suicidal or homicidal thoughts.  Still has anxiety.  She takes the Xanax  as needed but it is not as effective as it once was.  It takes longer to take effect as well.  She does not usually have panic attacks.  She can just get so overwhelmed she is on edge.  She has still been pretty impulsive.  She is able to stop herself from doing things a lot of the time though.  An example is her wanting to immediately go get a gym membership after the gym equipment at her apartment complex was not working this morning.  She was able to stop herself, will go to the apartment manager and see what the problem is before she goes spending money on a gym membership.  Increasing the Abilify  at the last visit has been helpful but she feels like it could be even better. Patient denies increased energy with decreased need for sleep, increased talkativeness, racing thoughts, risky behaviors, increased spending, increased libido, grandiosity, increased irritability or anger, paranoia, or  hallucinations.  Denies dizziness, syncope, seizures, numbness, tingling, tremor, tics, unsteady gait, slurred speech, confusion. Denies muscle or joint pain, stiffness, or dystonia. Denies unexplained weight loss, frequent infections, or sores that heal slowly.  No polyphagia, polydipsia, or polyuria. Denies visual changes or paresthesias.   Individual Medical History/ Review of Systems: Changes? :No      Past medications for mental health diagnoses include: Depakote, Lamictal, Latuda, Seroquel, melatonin did not help, for regular cause tremor and anxiety, Sonata did not help, Ambien caused her to eat too much, trazodone did not help, Lunesta , Zoloft, Wellbutrin , Restoril did not help, Belsomra    Allergies: Lamictal [lamotrigine]  Current Medications:  Current Outpatient Medications:    acetaminophen  (TYLENOL ) 500 MG tablet, Take 2 tablets (1,000 mg total) by mouth every 8 (eight) hours as needed., Disp: 30 tablet, Rfl: 0   ARIPiprazole  (ABILIFY ) 10 MG tablet, TAKE 1 TABLET BY MOUTH DAILY, Disp: 60 tablet, Rfl: 0   ARIPiprazole  (ABILIFY ) 15 MG tablet, Take 1 tablet (15 mg total) by mouth daily., Disp: 90 tablet, Rfl: 1   clonazePAM (KLONOPIN) 1 MG tablet, Take 1 tablet (1 mg total) by mouth 2 (two) times daily as needed for anxiety., Disp: 60 tablet, Rfl: 1   amitriptyline  (ELAVIL ) 150 MG tablet, Take 1 tablet (150 mg total) by mouth at bedtime as needed for sleep., Disp: 90 tablet, Rfl: 1   buPROPion  (WELLBUTRIN  XL) 150 MG 24 hr  tablet, Take 3 tablets (450 mg total) by mouth daily., Disp: 270 tablet, Rfl: 1   ibuprofen  (ADVIL ) 800 MG tablet, Take 1 tablet (800 mg total) by mouth every 8 (eight) hours as needed. (Patient not taking: Reported on 11/03/2023), Disp: 30 tablet, Rfl: 1   Multiple Vitamin (MULTIVITAMIN) tablet, Take 1 tablet by mouth daily. (Patient not taking: Reported on 08/11/2021), Disp: , Rfl:    Multiple Vitamins-Minerals (MULTIVITAMIN WITH MINERALS) tablet, Take 1 tablet by  mouth daily. Woman's (Patient not taking: Reported on 08/11/2021), Disp: , Rfl:  Medication Side Effects: weight gain  Family Medical/ Social History: Changes? None  MENTAL HEALTH EXAM:  There were no vitals taken for this visit.There is no height or weight on file to calculate BMI.  General Appearance: Casual, Well Groomed, and overweight  Eye Contact:  Good  Speech:  Clear and Coherent and Normal Rate  Volume:  Normal  Mood:  Euthymic  Affect:  Appropriate  Thought Process:  Goal Directed and Descriptions of Associations: Circumstantial  Orientation:  Full (Time, Place, and Person)  Thought Content: Logical   Suicidal Thoughts:  No  Homicidal Thoughts:  No  Memory:  WNL  Judgement:  Good  Insight:  Good  Psychomotor Activity:  Normal  Concentration:  Concentration: Good and Attention Span: Good  Recall:  Good  Fund of Knowledge: Good  Language: Good  Assets:  Desire for Improvement Financial Resources/Insurance Housing Transportation Vocational/Educational  ADL's:  Intact  Cognition: WNL  Prognosis:  Good   DIAGNOSES:    ICD-10-CM   1. Generalized anxiety disorder  F41.1     2. Recurrent major depressive disorder, in full remission (HCC)  F33.42     3. Insomnia, unspecified type  G47.00       Receiving Psychotherapy: No   RECOMMENDATIONS:  PDMP was reviewed.  Last Xanax  filled 10/25/2023. I provided 20 minutes of face to face time during this encounter, including time spent before and after the visit in records review, medical decision making, counseling pertinent to today's visit, and charting.   We discussed her symptoms.  We agree that increasing the Abilify  is a good idea.  If in 4 to 6 weeks the impulsivity is no better or if worse, call and I will increase to 20 mg.  I also recommend changing Xanax  to Klonopin.  The efficacy is longer but she will need to take it if she knows she will be in an anxiety provoking situation.  If it is effective, she can call in  approximately 6 to 8 weeks, let me know and I will send in enough refills to get her to the next appointment.  If it is not as effective, I can send in the Xanax , I prefer not to increase the dose if at all possible.   Continue amitriptyline   150 mg nightly as needed. Increase Abilify  to 15 mg every morning.   Continue Wellbutrin  XL 150 mg, 3 p.o. daily. Start Klonopin 1 mg, 1 p.o. twice daily as needed. Continue multivitamin daily. Return in 6 months.   Marvia Slocumb, PA-C

## 2023-11-20 ENCOUNTER — Other Ambulatory Visit: Payer: Self-pay | Admitting: Physician Assistant

## 2023-11-23 ENCOUNTER — Other Ambulatory Visit: Payer: Self-pay | Admitting: Physician Assistant

## 2023-12-17 ENCOUNTER — Other Ambulatory Visit: Payer: Self-pay | Admitting: Physician Assistant

## 2023-12-22 ENCOUNTER — Other Ambulatory Visit: Payer: Self-pay | Admitting: Physician Assistant

## 2024-01-27 ENCOUNTER — Other Ambulatory Visit: Payer: Self-pay | Admitting: Physician Assistant

## 2024-05-02 ENCOUNTER — Telehealth: Payer: Self-pay | Admitting: Physician Assistant

## 2024-05-02 NOTE — Telephone Encounter (Signed)
 Pt is not taking Abilify  10 mg, was increased to 15 mg.

## 2024-05-02 NOTE — Telephone Encounter (Signed)
 Patient requested refills for medications Amitriptuline 150mg , Abilify  10mg  Abilify  15mg , Wellbutrin  150mg , and Clonazepam  1mg . Ph: [phone] Appt 12/22 [facility]acy [facility] [address] [place]

## 2024-05-03 ENCOUNTER — Other Ambulatory Visit: Payer: Self-pay | Admitting: Physician Assistant

## 2024-05-03 ENCOUNTER — Other Ambulatory Visit: Payer: Self-pay

## 2024-05-03 DIAGNOSIS — F411 Generalized anxiety disorder: Secondary | ICD-10-CM

## 2024-05-03 MED ORDER — ARIPIPRAZOLE 15 MG PO TABS: 15.0000 mg | ORAL_TABLET | Freq: Every day | ORAL | 1 refills | Status: DC

## 2024-05-03 MED ORDER — CLONAZEPAM 1 MG PO TABS: ORAL_TABLET | ORAL | 1 refills | Status: DC

## 2024-05-03 MED ORDER — BUPROPION HCL ER (XL) 150 MG PO TB24: 450.0000 mg | ORAL_TABLET | Freq: Every day | ORAL | 1 refills | Status: AC

## 2024-05-03 MED ORDER — AMITRIPTYLINE HCL 150 MG PO TABS: 150.0000 mg | ORAL_TABLET | Freq: Every evening | ORAL | 1 refills | Status: DC | PRN

## 2024-05-03 NOTE — Telephone Encounter (Signed)
 Pended clonazepam , sent amitriptyline , Abilify  15 mg, Wellbutrin  to requested pharmacy.

## 2024-05-05 ENCOUNTER — Ambulatory Visit: Payer: Self-pay | Admitting: Physician Assistant

## 2024-05-21 ENCOUNTER — Encounter (HOSPITAL_BASED_OUTPATIENT_CLINIC_OR_DEPARTMENT_OTHER): Payer: Self-pay

## 2024-05-21 ENCOUNTER — Emergency Department (HOSPITAL_BASED_OUTPATIENT_CLINIC_OR_DEPARTMENT_OTHER)
Admission: EM | Admit: 2024-05-21 | Discharge: 2024-05-21 | Disposition: A | Discharge status: Home / Self Care | Attending: Emergency Medicine | Admitting: Emergency Medicine

## 2024-05-21 ENCOUNTER — Other Ambulatory Visit: Payer: Self-pay

## 2024-05-21 DIAGNOSIS — H1132 Conjunctival hemorrhage, left eye: Secondary | ICD-10-CM | POA: Diagnosis not present

## 2024-05-21 DIAGNOSIS — H5789 Other specified disorders of eye and adnexa: Secondary | ICD-10-CM | POA: Diagnosis present

## 2024-05-21 MED ORDER — TETRACAINE HCL 0.5 % OP SOLN
2.0000 [drp] | Freq: Once | OPHTHALMIC | Status: AC
  Administered 2024-05-21: 2 [drp] via OPHTHALMIC
  Filled 2024-05-21: qty 4

## 2024-05-21 MED ORDER — ERYTHROMYCIN 5 MG/GM OP OINT
TOPICAL_OINTMENT | Freq: Once | OPHTHALMIC | Status: AC
  Filled 2024-05-21: qty 3.5

## 2024-05-21 MED ORDER — FLUORESCEIN SODIUM 1 MG OP STRP
1.0000 | ORAL_STRIP | Freq: Once | OPHTHALMIC | Status: AC
  Administered 2024-05-21: 1 via OPHTHALMIC
  Filled 2024-05-21: qty 1

## 2024-05-21 NOTE — Discharge Instructions (Signed)
 Erythromycin 3 times a day for 3 days.  Cool compress.  Tylenol  and ibuprofen  for pain.  Avoid rubbing.  Return if any worsening or concerning symptoms

## 2024-05-21 NOTE — ED Provider Notes (Signed)
 [facility] Provider Note   CSN: [id number] Arrival date & time: 05/21/24  2007     Patient presents with: Eye Problem   Leslie Mueller is a 48 y.o. female.  She is here with redness and discomfort to her left eye after she had her contact lens got stuck this morning.  She was able to remove the contact lens but the eye has been very red since then.  No blurry vision double vision.  No fever.  {Add pertinent medical, surgical, social history, OB history to YEP:67052} The history is provided by the patient.  Eye Problem Location:  Left eye Quality:  Foreign body sensation Onset quality:  Gradual Duration:  1 day Timing:  Constant Progression:  Unchanged Chronicity:  New Context: contact lenses   Relieved by:  Nothing Associated symptoms: redness   Associated symptoms: no decreased vision        Prior to Admission medications   Medication Sig Start Date End Date Taking? Authorizing Provider  acetaminophen  (TYLENOL ) 500 MG tablet Take 2 tablets (1,000 mg total) by mouth every 8 (eight) hours as needed. 08/14/20   Rosalva Sawyer, MD  amitriptyline  (ELAVIL ) 150 MG tablet Take 1 tablet (150 mg total) by mouth at bedtime as needed for sleep. 05/03/24   Rhys Boyer T, PA-C  ARIPiprazole  (ABILIFY ) 15 MG tablet Take 1 tablet (15 mg total) by mouth daily. 05/03/24   Rhys Boyer T, PA-C  buPROPion  (WELLBUTRIN  XL) 150 MG 24 hr tablet Take 3 tablets (450 mg total) by mouth daily. 05/03/24   Rhys Boyer T, PA-C  clonazePAM  (KLONOPIN ) 1 MG tablet TAKE 1 TABLET BY MOUTH 2 TIMES A DAY AS NEEDED FOR ANXIETY 05/03/24   Hurst, Boyer T, PA-C  ibuprofen  (ADVIL ) 800 MG tablet Take 1 tablet (800 mg total) by mouth every 8 (eight) hours as needed. Patient not taking: Reported on 11/03/2023 08/14/20   Rosalva Sawyer, MD  Multiple Vitamin (MULTIVITAMIN) tablet Take 1 tablet by mouth daily. Patient not taking: Reported on 08/11/2021    [provider]   Multiple Vitamins-Minerals (MULTIVITAMIN WITH MINERALS) tablet Take 1 tablet by mouth daily. [facility] Patient not taking: Reported on 08/11/2021    [provider]  amitriptyline  (ELAVIL ) 75 MG tablet TAKE ONE TO ONE AND ONE-HALF TABLETS BY MOUTH EVERY NIGHT AT BEDTIME AS DIRECTED NEED AN APPOINTMENT 06/14/23   Rhys Boyer T, PA-C    Allergies: Lamictal [lamotrigine]    Review of Systems  Eyes:  Positive for redness.    Updated Vital Signs BP (!) 188/70   Pulse 100   Temp 97.8 F (36.6 C) (Oral)   Resp 16   Ht 5' 4 (1.626 m)   Wt 98.9 kg   SpO2 97%   BMI 37.43 kg/m   Physical Exam Vitals and nursing note reviewed.  Constitutional:      Appearance: Normal appearance.  HENT:     Head: Normocephalic and atraumatic.  Eyes:     General: Lids are everted, no foreign bodies appreciated.     Extraocular Movements: Extraocular movements intact.     Conjunctiva/sclera:     Left eye: Left conjunctiva is injected. No exudate.    Pupils: Pupils are equal, round, and reactive to light.      Comments: Fluorescein  with negative uptake.  No foreign body visualized.  Anterior chamber clear and deep.  Pupil equal round and reactive.  Neurological:     Mental Status: She is alert.     (  all labs ordered are listed, but only abnormal results are displayed) Labs Reviewed - No data to display  EKG: None  Radiology: No results found.  {Document cardiac monitor, telemetry assessment procedure when appropriate:32947} Procedures   Medications Ordered in the ED  fluorescein ophthalmic strip 1 strip (has no administration in time range)  tetracaine (PONTOCAINE) 0.5 % ophthalmic solution 2 drop (has no administration in time range)      {Click here for ABCD2, HEART and other calculators REFRESH Note before signing:1}                              Medical Decision Making Risk Prescription drug management.   ***  {Document critical care time when appropriate  Document  review of labs and clinical decision tools ie CHADS2VASC2, etc  Document your independent review of radiology images and any outside records  Document your discussion with family members, caretakers and with consultants  Document social determinants of health affecting pt's care  Document your decision making why or why not admission, treatments were needed:32947:::1}   Final diagnoses:  None    ED Discharge Orders     None

## 2024-05-21 NOTE — ED Triage Notes (Signed)
 Pt states that her contact got stuck under her L eyelid. Pt states that she was able to remove contact but now has scratchy feeling in eye. Redness to sclera.

## 2024-06-12 ENCOUNTER — Encounter: Payer: Self-pay | Admitting: Physician Assistant

## 2024-06-12 ENCOUNTER — Ambulatory Visit: Payer: Self-pay | Admitting: Physician Assistant

## 2024-06-12 DIAGNOSIS — F411 Generalized anxiety disorder: Secondary | ICD-10-CM

## 2024-06-12 DIAGNOSIS — G4700 Insomnia, unspecified: Secondary | ICD-10-CM

## 2024-06-12 DIAGNOSIS — F422 Mixed obsessional thoughts and acts: Secondary | ICD-10-CM | POA: Diagnosis not present

## 2024-06-12 DIAGNOSIS — F3341 Major depressive disorder, recurrent, in partial remission: Secondary | ICD-10-CM

## 2024-06-12 MED ORDER — CLONAZEPAM 1 MG PO TABS: ORAL_TABLET | ORAL | 1 refills | Status: AC

## 2024-06-12 MED ORDER — AMITRIPTYLINE HCL 150 MG PO TABS: 225.0000 mg | ORAL_TABLET | Freq: Every evening | ORAL | 1 refills | Status: AC | PRN

## 2024-06-12 NOTE — Progress Notes (Signed)
 "     [facility]  Patient ID: Leslie Mueller,  MRN: [id number]  PCP: Patient, No Pcp Per  Date of Evaluation: 06/12/2024 Time spent:20 minutes  Chief Complaint:  Chief Complaint   Anxiety; Depression; Follow-up    HISTORY/CURRENT STATUS: HPI  for routine med check.   Abilify  was increased about 6 months ago. That has helped with the impulsivity. She denies increased energy with decreased need for sleep, increased talkativeness, racing thoughts, risky behaviors, increased spending, increased libido, grandiosity, increased irritability or anger, paranoia, or hallucinations.  OCD is pretty bad. She can't get things off her mind.  Catches herself doing things like checking email over and over. Also checks her work schedule over multiple times. 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  Not afraid of flying.  She's so used to it, even turbulence doesn't phase her so that can't be a trigger.  The Klonopin  doesn't help.  Not having physical sx.   She is able to enjoy things.  Energy and motivation are good.  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   No extreme sadness, tearfulness, or feelings of hopelessness.  Sleeps ok. ADLs and personal hygiene are normal.   Denies any changes in concentration, making decisions, or remembering things.  Appetite has not changed.  No SI/HI.  Individual Medical History/ Review of Systems: Changes? :No      Past medications for mental health diagnoses include: Depakote, Lamictal, Latuda, Seroquel, melatonin did not help, for regular cause tremor and anxiety, Sonata did not help, Ambien caused her to eat too much, trazodone did not help, Lunesta , Zoloft, Wellbutrin , Restoril did not help, Belsomra   Allergies: Lamictal [lamotrigine]  Current Medications:  Current Outpatient Medications:    acetaminophen  (TYLENOL ) 500 MG tablet, Take 2 tablets (1,000 mg total) by  mouth every 8 (eight) hours as needed., Disp: 30 tablet, Rfl: 0   ARIPiprazole  (ABILIFY ) 15 MG tablet, Take 1 tablet (15 mg total) by mouth daily., Disp: 90 tablet, Rfl: 1   buPROPion  (WELLBUTRIN  XL) 150 MG 24 hr tablet, Take 3 tablets (450 mg total) by mouth daily., Disp: 270 tablet, Rfl: 1   ibuprofen  (ADVIL ) 800 MG tablet, Take 1 tablet (800 mg total) by mouth every 8 (eight) hours as needed., Disp: 30 tablet, Rfl: 1   Multiple Vitamin (MULTIVITAMIN) tablet, Take 1 tablet by mouth daily., Disp: , Rfl:    amitriptyline  (ELAVIL ) 150 MG tablet, Take 1.5 tablets (225 mg total) by mouth at bedtime as needed for sleep., Disp: 135 tablet, Rfl: 1   clonazePAM  (KLONOPIN ) 1 MG tablet, TAKE 1 TABLET BY MOUTH 2 TIMES A DAY AS NEEDED FOR ANXIETY, Disp: 60 tablet, Rfl: 1   Multiple Vitamins-Minerals (MULTIVITAMIN WITH MINERALS) tablet, Take 1 tablet by mouth daily. [facility] (Patient not taking: Reported on 08/11/2021), Disp: , Rfl:  Medication Side Effects: weight gain  Family Medical/ Social History: Changes? None  MENTAL HEALTH EXAM:  There were no vitals taken for this visit.There is no height or weight on file to calculate BMI.  General Appearance: Casual, Well Groomed, and overweight  Eye Contact:  Good  Speech:  Clear and Coherent and Normal Rate  Volume:  Normal  Mood:  Euthymic  Affect:  Appropriate  Thought Process:  Goal Directed and Descriptions of Associations: Circumstantial  Orientation:  Full (Time, Place, and Person)  Thought Content: Logical   Suicidal Thoughts:  No  Homicidal Thoughts:  No  Memory:  WNL  Judgement:  Good  Insight:  Good  Psychomotor Activity:  Normal  Concentration:  Concentration: Good and Attention Span: Good  Recall:  Good  Fund of Knowledge: Good  Language: Good  Assets:  Communication Skills Desire for Improvement Financial Resources/Insurance Housing Transportation Vocational/Educational  ADL's:  Intact  Cognition: WNL  Prognosis:  Good    DIAGNOSES:    ICD-10-CM   1. Recurrent major depressive disorder, in partial remission  F33.41     2. Generalized anxiety disorder  F41.1 clonazePAM  (KLONOPIN ) 1 MG tablet    3. Insomnia, unspecified type  G47.00     4. Mixed obsessional thoughts and acts  F42.2       Receiving Psychotherapy: No   RECOMMENDATIONS:  PDMP was reviewed.  Last Klonopin  filled 05/07/2024. I provided approximately  20 minutes of face to face time during this encounter, including time spent before and after the visit in records review, medical decision making, counseling pertinent to today's visit, and charting.   We discussed the anxiety.  I recommend increasing amitriptyline  to help prevent it.  Pros and cons discussed.  She would like to try it.  On the Klonopin  she can take 1.5 to 2 pills at the same time if needed.  No more than 2 pills/day.  Increase amitriptyline   150 mg to 1.5 pills nightly.           Continue Abilify   15 mg every morning.   Continue Wellbutrin  XL 150 mg, 3 p.o. daily. Continue Klonopin  1 mg, 1-2 p.o. daily as needed anxiety. Continue multivitamin daily. Return in 4 to 6 weeks.  Verneita Cooks, PA-C  "

## 2024-07-21 ENCOUNTER — Ambulatory Visit (INDEPENDENT_AMBULATORY_CARE_PROVIDER_SITE_OTHER): Payer: Self-pay | Admitting: Physician Assistant

## 2024-07-21 ENCOUNTER — Encounter: Payer: Self-pay | Admitting: Physician Assistant

## 2024-07-21 DIAGNOSIS — F411 Generalized anxiety disorder: Secondary | ICD-10-CM

## 2024-07-21 DIAGNOSIS — F422 Mixed obsessional thoughts and acts: Secondary | ICD-10-CM

## 2024-07-21 DIAGNOSIS — F3342 Major depressive disorder, recurrent, in full remission: Secondary | ICD-10-CM | POA: Diagnosis not present

## 2024-07-21 DIAGNOSIS — F319 Bipolar disorder, unspecified: Secondary | ICD-10-CM

## 2024-07-21 MED ORDER — ARIPIPRAZOLE 20 MG PO TABS: 20.0000 mg | ORAL_TABLET | Freq: Every day | ORAL | 0 refills | Status: AC

## 2024-07-21 NOTE — Progress Notes (Signed)
 "     [facility]  Patient ID: Leslie Mueller,  MRN: [id number]  PCP: Patient, No Pcp Per  Date of Evaluation: 07/21/2024 Time spent:20 minutes  Chief Complaint:  Chief Complaint   Depression; Anxiety; Follow-up    HISTORY/CURRENT STATUS: HPI  for routine med check.   Is compulsive, keeps checking her email over and over and over.  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  She was starting to feel like this back in December, reported at our last visit and amitriptyline  was increased.  It has not helped much at all.  She has obsessions about the things noted above and the compulsions are to keep checking her emails, and make sure all of her bills are paid to be gone for 5 weeks, things like that.  She is not spending a lot of money or having increased libido, no grandiosity, no trouble sleeping and no increased energy.  No psychosis reported.   She had a few PA in early January.  Up until then it had been a very long time since she had a panic attack.  No panic attacks now but feels overwhelmed as described above.  No symptoms of depression.  Energy and motivation are good.  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  No extreme sadness, tearfulness, or feelings of hopelessness.  Sleeps well.  ADLs and personal hygiene are normal.   Denies any changes in concentration, making decisions, or remembering things.  Appetite has not changed.  Weight is stable.  No SI/HI.  Individual Medical History/ Review of Systems: Changes? :No      Past medications for mental health diagnoses include: Depakote, Lamictal, Latuda, Seroquel, melatonin did not help, for regular cause tremor and anxiety, Sonata did not help, Ambien caused her to eat too much, trazodone did not help, Lunesta , Zoloft, Wellbutrin , Restoril did not help, Belsomra    Allergies: Lamictal [lamotrigine]  Current Medications:  Current Outpatient Medications:    ARIPiprazole  (ABILIFY ) 20 MG tablet, Take 1 tablet (20 mg total) by mouth daily., Disp: 90 tablet, Rfl: 0   acetaminophen  (TYLENOL ) 500 MG tablet, Take 2 tablets (1,000 mg total) by mouth every 8 (eight) hours as needed., Disp: 30 tablet, Rfl: 0   amitriptyline  (ELAVIL ) 150 MG tablet, Take 1.5 tablets (225 mg total) by mouth at bedtime as needed for sleep., Disp: 135 tablet, Rfl: 1   buPROPion  (WELLBUTRIN  XL) 150 MG 24 hr tablet, Take 3 tablets (450 mg total) by mouth daily., Disp: 270 tablet, Rfl: 1   clonazePAM  (KLONOPIN ) 1 MG tablet, TAKE 1 TABLET BY MOUTH 2 TIMES A DAY AS NEEDED FOR ANXIETY, Disp: 60 tablet, Rfl: 1   ibuprofen  (ADVIL ) 800 MG tablet, Take 1 tablet (800 mg total) by mouth every 8 (eight) hours as needed., Disp: 30 tablet, Rfl: 1   Multiple Vitamin (MULTIVITAMIN) tablet, Take 1 tablet by mouth daily., Disp: , Rfl:    Multiple Vitamins-Minerals (MULTIVITAMIN WITH MINERALS) tablet, Take 1 tablet by mouth daily. Woman's (Patient not taking: Reported on 08/11/2021), Disp: , Rfl:  Medication Side Effects: weight gain  Family Medical/ Social History: Changes? None  MENTAL HEALTH EXAM:  There were no vitals taken for this visit.There is no height or weight on file to calculate BMI.  General Appearance: Casual and Well Groomed  Eye Contact:  Good  Speech:  Clear and Coherent and Normal Rate  Volume:  Normal  Mood:  Euthymic  Affect:  Appropriate  Thought Process:  Goal Directed and Descriptions of Associations: Circumstantial  Orientation:  Full (Time, Place, and Person)  Thought Content: Logical   Suicidal Thoughts:  No  Homicidal Thoughts:  No  Memory:  WNL  Judgement:  Good  Insight:  Good  Psychomotor Activity:  Normal  Concentration:  Concentration: Good and Attention Span: Good  Recall:  Good  Fund of Knowledge: Good  Language: Good  Assets:  Communication Skills Desire  for Improvement Financial Resources/Insurance Housing Resilience Transportation Vocational/Educational  ADL's:  Intact  Cognition: WNL  Prognosis:  Good   DIAGNOSES:    ICD-10-CM   1. Mixed obsessional thoughts and acts  F42.2     2. Generalized anxiety disorder  F41.1     3. Recurrent major depressive disorder, in full remission  F33.42     4. Bipolar I disorder (HCC)  F31.9       Receiving Psychotherapy: No   RECOMMENDATIONS:  PDMP was reviewed.  Last Klonopin  filled 05/07/2024. I provided approximately  20 minutes of face to face time during this encounter, including time spent before and after the visit in records review, medical decision making, counseling pertinent to today's visit, and charting.   I think the OCD is getting worse instead of better.  These behaviors could also be triggered by her new job, the excitement but anxiety surrounding that.  I am a little concerned that this could be the beginning of a manic episode.  I recommend increasing the Abilify  which will treat the OCD symptoms and if this is the beginnings of mania with no other symptoms showing up so far then the higher dose of the Abilify  should prevent things from worsening.  She would like to proceed with this plan.  Continue amitriptyline   150 mg,  1.5 pills nightly.           Increase Abilify  to 20 mg every morning.   Continue Wellbutrin  XL 150 mg, 3 p.o. daily. Continue Klonopin  1 mg, 1-2 p.o. daily as needed anxiety. Continue multivitamin daily. Return in 6 weeks.  Verneita Cooks, PA-C  "

## 2024-09-01 ENCOUNTER — Ambulatory Visit: Admitting: Physician Assistant
